# Patient Record
Sex: Male | Born: 1960 | Race: White | Hispanic: No | Marital: Single | Smoking: Former smoker
Health system: Southern US, Community
[De-identification: ages and names within clinical notes are randomized; demographics above are authoritative.]

## PROBLEM LIST (undated history)

## (undated) DIAGNOSIS — I10 Essential (primary) hypertension: Secondary | ICD-10-CM

## (undated) DIAGNOSIS — M1380 Other specified arthritis, unspecified site: Secondary | ICD-10-CM

## (undated) DIAGNOSIS — M1990 Unspecified osteoarthritis, unspecified site: Secondary | ICD-10-CM

## (undated) HISTORY — DX: Other specified arthritis, unspecified site: M13.80

## (undated) HISTORY — DX: Unspecified osteoarthritis, unspecified site: M19.90

## (undated) HISTORY — PX: HEMORRHOID SURGERY: SHX153

## (undated) HISTORY — DX: Essential (primary) hypertension: I10

---

## 2007-03-03 ENCOUNTER — Ambulatory Visit: Payer: Self-pay | Admitting: Internal Medicine

## 2007-03-10 ENCOUNTER — Ambulatory Visit: Payer: Self-pay | Admitting: Podiatry

## 2007-03-11 ENCOUNTER — Ambulatory Visit: Payer: Self-pay | Admitting: Podiatry

## 2007-03-13 ENCOUNTER — Ambulatory Visit: Payer: Self-pay | Admitting: Internal Medicine

## 2007-04-03 ENCOUNTER — Ambulatory Visit: Payer: Self-pay | Admitting: Internal Medicine

## 2007-10-07 ENCOUNTER — Ambulatory Visit: Payer: Self-pay | Admitting: Rheumatology

## 2008-06-29 ENCOUNTER — Ambulatory Visit: Payer: Self-pay | Admitting: Rheumatology

## 2008-07-05 ENCOUNTER — Ambulatory Visit: Payer: Self-pay | Admitting: Rheumatology

## 2014-05-03 DIAGNOSIS — R599 Enlarged lymph nodes, unspecified: Secondary | ICD-10-CM | POA: Insufficient documentation

## 2014-05-03 DIAGNOSIS — M1990 Unspecified osteoarthritis, unspecified site: Secondary | ICD-10-CM | POA: Insufficient documentation

## 2014-05-03 DIAGNOSIS — R591 Generalized enlarged lymph nodes: Secondary | ICD-10-CM | POA: Insufficient documentation

## 2014-11-02 DIAGNOSIS — M1990 Unspecified osteoarthritis, unspecified site: Secondary | ICD-10-CM | POA: Diagnosis not present

## 2015-03-29 DIAGNOSIS — J029 Acute pharyngitis, unspecified: Secondary | ICD-10-CM | POA: Diagnosis not present

## 2015-03-29 DIAGNOSIS — R05 Cough: Secondary | ICD-10-CM | POA: Diagnosis not present

## 2015-05-04 DIAGNOSIS — I00 Rheumatic fever without heart involvement: Secondary | ICD-10-CM | POA: Diagnosis not present

## 2015-10-23 DIAGNOSIS — M1990 Unspecified osteoarthritis, unspecified site: Secondary | ICD-10-CM | POA: Diagnosis not present

## 2015-10-23 DIAGNOSIS — M67431 Ganglion, right wrist: Secondary | ICD-10-CM | POA: Diagnosis not present

## 2016-04-22 DIAGNOSIS — M1990 Unspecified osteoarthritis, unspecified site: Secondary | ICD-10-CM | POA: Diagnosis not present

## 2016-04-22 DIAGNOSIS — Z79899 Other long term (current) drug therapy: Secondary | ICD-10-CM | POA: Diagnosis not present

## 2016-08-08 DIAGNOSIS — M1990 Unspecified osteoarthritis, unspecified site: Secondary | ICD-10-CM

## 2016-08-08 DIAGNOSIS — I10 Essential (primary) hypertension: Secondary | ICD-10-CM

## 2016-09-03 ENCOUNTER — Encounter: Payer: Self-pay | Admitting: Unknown Physician Specialty

## 2016-09-03 ENCOUNTER — Ambulatory Visit (INDEPENDENT_AMBULATORY_CARE_PROVIDER_SITE_OTHER): Payer: Medicare Other | Admitting: Unknown Physician Specialty

## 2016-09-03 VITALS — BP 120/81 | HR 64 | Temp 97.8°F | Ht 70.1 in | Wt 220.6 lb

## 2016-09-03 DIAGNOSIS — M1990 Unspecified osteoarthritis, unspecified site: Secondary | ICD-10-CM | POA: Diagnosis not present

## 2016-09-03 DIAGNOSIS — Z0000 Encounter for general adult medical examination without abnormal findings: Secondary | ICD-10-CM | POA: Diagnosis not present

## 2016-09-03 DIAGNOSIS — I10 Essential (primary) hypertension: Secondary | ICD-10-CM | POA: Diagnosis not present

## 2016-09-03 NOTE — Assessment & Plan Note (Signed)
Stable, continue present medications.   

## 2016-09-03 NOTE — Progress Notes (Signed)
BP 120/81 (BP Location: Left Arm, Patient Position: Sitting, Cuff Size: Large)   Pulse 64   Temp 97.8 F (36.6 C)   Ht 5' 10.1" (1.781 m)   Wt 220 lb 9.6 oz (100.1 kg)   SpO2 97%   BMI 31.56 kg/m    Subjective:    Patient ID: Hunter Huffman, male    DOB: 04-Oct-1960, 56 y.o.   MRN: [id number]  HPI: Hunter Huffman is a 56 y.o. male  Chief Complaint  Patient presents with  . Establish Care  . Labs Only    pt is interested in Hep C and HIV    Sinus Problem  This is a new problem. The current episode started 1 to 4 weeks ago. The problem has been waxing and waning since onset. There has been no fever. He is experiencing no pain. Associated symptoms include congestion. Pertinent negatives include no chills, coughing, shortness of breath or sore throat. Past treatments include nothing. The treatment provided no relief.   Inflammatory Arthritis Through rheumatology   Hypertension Using medications without difficulty Average home BPs Not checking   No problems or lightheadedness No chest pain with exertion or shortness of breath No Edema  Relevant past medical, surgical, family and social history reviewed and updated as indicated. Interim medical history since our last visit reviewed. Allergies and medications reviewed and updated.  Review of Systems  Constitutional: Negative for chills.  HENT: Positive for congestion. Negative for sore throat.   Respiratory: Negative for cough and shortness of breath.   Cardiovascular: Negative.   Gastrointestinal: Negative.   Endocrine: Negative.   Genitourinary: Negative.   Musculoskeletal: Negative.   Skin: Negative.   Allergic/Immunologic: Negative.   Neurological: Negative.   Hematological: Negative.   Psychiatric/Behavioral: Negative.     Per HPI unless specifically indicated above     Objective:    BP 120/81 (BP Location: Left Arm, Patient Position: Sitting, Cuff Size: Large)   Pulse 64   Temp 97.8 F (36.6 C)   Ht  5' 10.1" (1.781 m)   Wt 220 lb 9.6 oz (100.1 kg)   SpO2 97%   BMI 31.56 kg/m   Wt Readings from Last 3 Encounters:  09/03/16 220 lb 9.6 oz (100.1 kg)    Physical Exam  Constitutional: He is oriented to person, place, and time. He appears well-developed and well-nourished.  HENT:  Head: Normocephalic.  Right Ear: Tympanic membrane, external ear and ear canal normal.  Left Ear: Tympanic membrane, external ear and ear canal normal.  Mouth/Throat: Uvula is midline, oropharynx is clear and moist and mucous membranes are normal.  Eyes: Pupils are equal, round, and reactive to light.  Cardiovascular: Normal rate, regular rhythm and normal heart sounds.  Exam reveals no gallop and no friction rub.   No murmur heard. Pulmonary/Chest: Effort normal and breath sounds normal. No respiratory distress.  Abdominal: Soft. Bowel sounds are normal. He exhibits no distension. There is no tenderness.  Genitourinary: Rectum normal and prostate normal.  Musculoskeletal: Normal range of motion.  Neurological: He is alert and oriented to person, place, and time. He has normal reflexes.  Skin: Skin is warm and dry.  Psychiatric: He has a normal mood and affect. His behavior is normal. Judgment and thought content normal.    No results found for this or any previous visit.    Assessment & Plan:   Problem List Items Addressed This Visit      Unprioritized   Hypertension  Stable, continue present medications.        Relevant Medications   hydrochlorothiazide (HYDRODIURIL) 25 MG tablet   Inflammatory arthritis    Per rheumatology      Relevant Medications   ENBREL SURECLICK 50 MG/ML injection   naproxen (NAPROSYN) 500 MG tablet    Other Visit Diagnoses    Routine general medical examination at a health care facility    -  Primary   Relevant Orders   Comprehensive metabolic panel   PSA   TSH   CBC with Differential/Platelet   Lipid Panel w/o Chol/HDL Ratio   Ambulatory referral to  Gastroenterology       Follow up plan: Return in about 1 year (around 09/03/2017).

## 2016-09-03 NOTE — Assessment & Plan Note (Signed)
Per rheumatology 

## 2016-09-04 ENCOUNTER — Encounter: Payer: Self-pay | Admitting: Unknown Physician Specialty

## 2016-09-04 LAB — COMPREHENSIVE METABOLIC PANEL
ALK PHOS: 72 IU/L (ref 39–117)
ALT: 20 IU/L (ref 0–44)
AST: 18 IU/L (ref 0–40)
Albumin/Globulin Ratio: 1.6 (ref 1.2–2.2)
Albumin: 4.1 g/dL (ref 3.5–5.5)
BILIRUBIN TOTAL: 0.6 mg/dL (ref 0.0–1.2)
BUN / CREAT RATIO: 11 (ref 9–20)
BUN: 12 mg/dL (ref 6–24)
CHLORIDE: 102 mmol/L (ref 96–106)
CO2: 24 mmol/L (ref 18–29)
CREATININE: 1.08 mg/dL (ref 0.76–1.27)
Calcium: 9.6 mg/dL (ref 8.7–10.2)
GFR calc Af Amer: 88 mL/min/{1.73_m2} (ref >59)
GFR calc non Af Amer: 76 mL/min/{1.73_m2} (ref >59)
GLUCOSE: 90 mg/dL (ref 65–99)
Globulin, Total: 2.5 g/dL (ref 1.5–4.5)
Potassium: 4.7 mmol/L (ref 3.5–5.2)
Sodium: 142 mmol/L (ref 134–144)
Total Protein: 6.6 g/dL (ref 6.0–8.5)

## 2016-09-04 LAB — CBC WITH DIFFERENTIAL/PLATELET
BASOS ABS: 0 10*3/uL (ref 0.0–0.2)
Basos: 0 %
EOS (ABSOLUTE): 0.3 10*3/uL (ref 0.0–0.4)
Eos: 3 %
HEMOGLOBIN: 15.2 g/dL (ref 13.0–17.7)
Hematocrit: 44.9 % (ref 37.5–51.0)
Immature Grans (Abs): 0 10*3/uL (ref 0.0–0.1)
Immature Granulocytes: 0 %
LYMPHS ABS: 3.7 10*3/uL — AB (ref 0.7–3.1)
Lymphs: 37 %
MCH: 31.8 pg (ref 26.6–33.0)
MCHC: 33.9 g/dL (ref 31.5–35.7)
MCV: 94 fL (ref 79–97)
MONOCYTES: 7 %
Monocytes Absolute: 0.7 10*3/uL (ref 0.1–0.9)
Neutrophils Absolute: 5.2 10*3/uL (ref 1.4–7.0)
Neutrophils: 53 %
Platelets: 243 10*3/uL (ref 150–379)
RBC: 4.78 x10E6/uL (ref 4.14–5.80)
RDW: 13.7 % (ref 12.3–15.4)
WBC: 9.9 10*3/uL (ref 3.4–10.8)

## 2016-09-04 LAB — LIPID PANEL W/O CHOL/HDL RATIO
CHOLESTEROL TOTAL: 153 mg/dL (ref 100–199)
HDL: 36 mg/dL — ABNORMAL LOW (ref >39)
LDL CALC: 93 mg/dL (ref 0–99)
TRIGLYCERIDES: 118 mg/dL (ref 0–149)
VLDL Cholesterol Cal: 24 mg/dL (ref 5–40)

## 2016-09-04 LAB — TSH: TSH: 2.57 u[IU]/mL (ref 0.450–4.500)

## 2016-09-04 LAB — PSA: PROSTATE SPECIFIC AG, SERUM: 0.8 ng/mL (ref 0.0–4.0)

## 2016-12-02 DIAGNOSIS — B079 Viral wart, unspecified: Secondary | ICD-10-CM | POA: Diagnosis not present

## 2016-12-02 DIAGNOSIS — C44619 Basal cell carcinoma of skin of left upper limb, including shoulder: Secondary | ICD-10-CM | POA: Diagnosis not present

## 2016-12-02 DIAGNOSIS — L570 Actinic keratosis: Secondary | ICD-10-CM | POA: Diagnosis not present

## 2016-12-02 DIAGNOSIS — D485 Neoplasm of uncertain behavior of skin: Secondary | ICD-10-CM | POA: Diagnosis not present

## 2017-01-06 DIAGNOSIS — C44519 Basal cell carcinoma of skin of other part of trunk: Secondary | ICD-10-CM | POA: Diagnosis not present

## 2017-01-20 DIAGNOSIS — Z85828 Personal history of other malignant neoplasm of skin: Secondary | ICD-10-CM | POA: Diagnosis not present

## 2017-01-20 DIAGNOSIS — Z872 Personal history of diseases of the skin and subcutaneous tissue: Secondary | ICD-10-CM | POA: Diagnosis not present

## 2017-01-22 DIAGNOSIS — M1990 Unspecified osteoarthritis, unspecified site: Secondary | ICD-10-CM | POA: Diagnosis not present

## 2017-07-22 ENCOUNTER — Ambulatory Visit (INDEPENDENT_AMBULATORY_CARE_PROVIDER_SITE_OTHER): Payer: Medicare Other

## 2017-07-22 DIAGNOSIS — Z23 Encounter for immunization: Secondary | ICD-10-CM | POA: Diagnosis not present

## 2017-09-05 ENCOUNTER — Encounter: Payer: Self-pay | Admitting: Unknown Physician Specialty

## 2017-09-05 ENCOUNTER — Ambulatory Visit (INDEPENDENT_AMBULATORY_CARE_PROVIDER_SITE_OTHER): Payer: Medicare Other | Admitting: Unknown Physician Specialty

## 2017-09-05 VITALS — BP 126/78 | HR 67 | Temp 98.5°F | Ht 70.1 in | Wt 210.2 lb

## 2017-09-05 DIAGNOSIS — E7800 Pure hypercholesterolemia, unspecified: Secondary | ICD-10-CM | POA: Diagnosis not present

## 2017-09-05 DIAGNOSIS — Z0000 Encounter for general adult medical examination without abnormal findings: Secondary | ICD-10-CM | POA: Diagnosis not present

## 2017-09-05 DIAGNOSIS — F17210 Nicotine dependence, cigarettes, uncomplicated: Secondary | ICD-10-CM | POA: Diagnosis not present

## 2017-09-05 DIAGNOSIS — I10 Essential (primary) hypertension: Secondary | ICD-10-CM

## 2017-09-05 DIAGNOSIS — Z0001 Encounter for general adult medical examination with abnormal findings: Secondary | ICD-10-CM

## 2017-09-05 DIAGNOSIS — K219 Gastro-esophageal reflux disease without esophagitis: Secondary | ICD-10-CM | POA: Diagnosis not present

## 2017-09-05 DIAGNOSIS — M1990 Unspecified osteoarthritis, unspecified site: Secondary | ICD-10-CM | POA: Diagnosis not present

## 2017-09-05 DIAGNOSIS — Z720 Tobacco use: Secondary | ICD-10-CM

## 2017-09-05 NOTE — Assessment & Plan Note (Signed)
I have recommended absolute tobacco cessation. I have discussed various options available for assistance with tobacco cessation including over the counter methods (Nicotine gum, patch and lozenges). We also discussed prescription options (Chantix, Nicotine Inhaler / Nasal Spray). The patient is not interested in pursuing any prescription tobacco cessation options at this time.  

## 2017-09-05 NOTE — Assessment & Plan Note (Signed)
ASCVD risk discussed.  Refuses statins at this time

## 2017-09-05 NOTE — Assessment & Plan Note (Signed)
Stable, continue present medications.   

## 2017-09-05 NOTE — Progress Notes (Signed)
BP 126/78   Pulse 67   Temp 98.5 F (36.9 C) (Oral)   Ht 5' 10.1" (1.781 m)   Wt 210 lb 3.2 oz (95.3 kg)   SpO2 96%   BMI 30.07 kg/m    Subjective:    Patient ID: Hunter Huffman, male    DOB: 07/24/1961, 57 y.o.   MRN: [id number]  HPI: Hunter Huffman is a 57 y.o. male  Chief Complaint  Patient presents with  . Medicare Wellness   Hypertension Using medications without difficulty Average home BPs   No problems or lightheadedness No chest pain with exertion or shortness of breath No Edema  The 10-year ASCVD risk score Mikey Bussing DC Jr., et al., 2013) is: 13%   Values used to calculate the score:     Age: 30 years     Sex: Male     Is Non-Hispanic African American: No     Diabetic: No     Tobacco smoker: Yes     Systolic Blood Pressure: 782 mmHg     Is BP treated: Yes     HDL Cholesterol: 36 mg/dL     Total Cholesterol: 153 mg/dL  GERD Managed with Omeprazole.  No stomach issues.    Family History  Problem Relation Age of Onset  . Diabetes Mother   . Diabetes Father   . Hypertension Father   . Arthritis Father   . Diabetes Brother    Social History   Socioeconomic History  . Marital status: Single    Spouse name: Not on file  . Number of children: Not on file  . Years of education: Not on file  . Highest education level: Not on file  Social Needs  . Financial resource strain: Not on file  . Food insecurity - worry: Not on file  . Food insecurity - inability: Not on file  . Transportation needs - medical: Not on file  . Transportation needs - non-medical: Not on file  Occupational History  . Not on file  Tobacco Use  . Smoking status: Current Every Day Smoker    Packs/day: 0.50    Types: Cigarettes  . Smokeless tobacco: Never Used  Substance and Sexual Activity  . Alcohol use: No    Comment: very rarely  . Drug use: No  . Sexual activity: Yes  Other Topics Concern  . Not on file  Social History Narrative  . Not on file   Past Medical  History:  Diagnosis Date  . Hypertension   . Inflammatory arthritis    Past Surgical History:  Procedure Laterality Date  . HEMORRHOID SURGERY     Relevant past medical, surgical, family and social history reviewed and updated as indicated. Interim medical history since our last visit reviewed. Allergies and medications reviewed and updated.  Review of Systems  Constitutional: Negative.   HENT: Negative.   Eyes: Negative.   Respiratory: Negative.   Cardiovascular: Negative.   Gastrointestinal: Negative.   Endocrine: Negative.   Genitourinary: Negative.   Musculoskeletal: Positive for arthralgias.  Skin: Negative.   Allergic/Immunologic: Negative.   Neurological: Negative.   Hematological: Negative.   Psychiatric/Behavioral: Negative.     Per HPI unless specifically indicated above     Objective:    BP 126/78   Pulse 67   Temp 98.5 F (36.9 C) (Oral)   Ht 5' 10.1" (1.781 m)   Wt 210 lb 3.2 oz (95.3 kg)   SpO2 96%   BMI 30.07 kg/m   Wt  Readings from Last 3 Encounters:  09/05/17 210 lb 3.2 oz (95.3 kg)  09/03/16 220 lb 9.6 oz (100.1 kg)    Physical Exam  Constitutional: He is oriented to person, place, and time. He appears well-developed and well-nourished. No distress.  HENT:  Head: Normocephalic and atraumatic.  Eyes: Conjunctivae and lids are normal. Right eye exhibits no discharge. Left eye exhibits no discharge. No scleral icterus.  Neck: Normal range of motion. Neck supple. No JVD present. Carotid bruit is not present.  Cardiovascular: Normal rate, regular rhythm and normal heart sounds.  Pulmonary/Chest: Effort normal and breath sounds normal. No respiratory distress.  Abdominal: Normal appearance. There is no splenomegaly or hepatomegaly.  Musculoskeletal: Normal range of motion.  Neurological: He is alert and oriented to person, place, and time.  Skin: Skin is warm, dry and intact. No rash noted. No pallor.  Psychiatric: He has a normal mood and  affect. His behavior is normal. Judgment and thought content normal.    Results for orders placed or performed in visit on 09/03/16  Comprehensive metabolic panel  Result Value Ref Range   Glucose 90 65 - 99 mg/dL   BUN 12 6 - 24 mg/dL   Creatinine, Ser 1.08 0.76 - 1.27 mg/dL   GFR calc non Af Amer 76 >59 mL/min/1.73   GFR calc Af Amer 88 >59 mL/min/1.73   BUN/Creatinine Ratio 11 9 - 20   Sodium 142 134 - 144 mmol/L   Potassium 4.7 3.5 - 5.2 mmol/L   Chloride 102 96 - 106 mmol/L   CO2 24 18 - 29 mmol/L   Calcium 9.6 8.7 - 10.2 mg/dL   Total Protein 6.6 6.0 - 8.5 g/dL   Albumin 4.1 3.5 - 5.5 g/dL   Globulin, Total 2.5 1.5 - 4.5 g/dL   Albumin/Globulin Ratio 1.6 1.2 - 2.2   Bilirubin Total 0.6 0.0 - 1.2 mg/dL   Alkaline Phosphatase 72 39 - 117 IU/L   AST 18 0 - 40 IU/L   ALT 20 0 - 44 IU/L  PSA  Result Value Ref Range   Prostate Specific Ag, Serum 0.8 0.0 - 4.0 ng/mL  TSH  Result Value Ref Range   TSH 2.570 0.450 - 4.500 uIU/mL  CBC with Differential/Platelet  Result Value Ref Range   WBC 9.9 3.4 - 10.8 x10E3/uL   RBC 4.78 4.14 - 5.80 x10E6/uL   Hemoglobin 15.2 13.0 - 17.7 g/dL   Hematocrit 44.9 37.5 - 51.0 %   MCV 94 79 - 97 fL   MCH 31.8 26.6 - 33.0 pg   MCHC 33.9 31.5 - 35.7 g/dL   RDW 13.7 12.3 - 15.4 %   Platelets 243 150 - 379 x10E3/uL   Neutrophils 53 Not Estab. %   Lymphs 37 Not Estab. %   Monocytes 7 Not Estab. %   Eos 3 Not Estab. %   Basos 0 Not Estab. %   Neutrophils Absolute 5.2 1.4 - 7.0 x10E3/uL   Lymphocytes Absolute 3.7 (H) 0.7 - 3.1 x10E3/uL   Monocytes Absolute 0.7 0.1 - 0.9 x10E3/uL   EOS (ABSOLUTE) 0.3 0.0 - 0.4 x10E3/uL   Basophils Absolute 0.0 0.0 - 0.2 x10E3/uL   Immature Granulocytes 0 Not Estab. %   Immature Grans (Abs) 0.0 0.0 - 0.1 x10E3/uL  Lipid Panel w/o Chol/HDL Ratio  Result Value Ref Range   Cholesterol, Total 153 100 - 199 mg/dL   Triglycerides 118 0 - 149 mg/dL   HDL 36 (L) >39 mg/dL   VLDL  Cholesterol Cal 24 5 - 40 mg/dL    LDL Calculated 93 0 - 99 mg/dL      Assessment & Plan:   Problem List Items Addressed This Visit      Unprioritized   GERD (gastroesophageal reflux disease)    Stable, continue present medications.        Hypercholesteremia    ASCVD risk discussed.  Refuses statins at this time      Relevant Orders   Lipid Panel w/o Chol/HDL Ratio   Hypertension    Stable, continue present medications.        Relevant Orders   Comprehensive metabolic panel   Inflammatory arthritis    Taking Embrel.  Through Dr Jefm Bryant.  Labs for med management      Tobacco abuse     I have recommended absolute tobacco cessation. I have discussed various options available for assistance with tobacco cessation including over the counter methods (Nicotine gum, patch and lozenges). We also discussed prescription options (Chantix, Nicotine Inhaler / Nasal Spray). The patient is not interested in pursuing any prescription tobacco cessation options at this time.        Other Visit Diagnoses    Annual physical exam    -  Primary   Relevant Orders   CBC with Differential/Platelet   TSH   PSA      Refusing prostate exam, screening colonoscopy, HIV and Hepatitis C  Follow up plan: Return in about 6 months (around 03/05/2018).

## 2017-09-05 NOTE — Assessment & Plan Note (Signed)
Taking Embrel.  Through Dr Jefm Bryant.  Labs for med management

## 2017-09-06 LAB — COMPREHENSIVE METABOLIC PANEL
ALBUMIN: 4.2 g/dL (ref 3.5–5.5)
ALT: 21 IU/L (ref 0–44)
AST: 19 IU/L (ref 0–40)
Albumin/Globulin Ratio: 1.8 (ref 1.2–2.2)
Alkaline Phosphatase: 72 IU/L (ref 39–117)
BILIRUBIN TOTAL: 0.7 mg/dL (ref 0.0–1.2)
BUN / CREAT RATIO: 11 (ref 9–20)
BUN: 12 mg/dL (ref 6–24)
CHLORIDE: 102 mmol/L (ref 96–106)
CO2: 22 mmol/L (ref 20–29)
Calcium: 9.5 mg/dL (ref 8.7–10.2)
Creatinine, Ser: 1.11 mg/dL (ref 0.76–1.27)
GFR calc non Af Amer: 73 mL/min/{1.73_m2} (ref >59)
GFR, EST AFRICAN AMERICAN: 85 mL/min/{1.73_m2} (ref >59)
GLOBULIN, TOTAL: 2.3 g/dL (ref 1.5–4.5)
GLUCOSE: 84 mg/dL (ref 65–99)
Potassium: 4.5 mmol/L (ref 3.5–5.2)
Sodium: 141 mmol/L (ref 134–144)
TOTAL PROTEIN: 6.5 g/dL (ref 6.0–8.5)

## 2017-09-06 LAB — CBC WITH DIFFERENTIAL/PLATELET
BASOS ABS: 0.1 10*3/uL (ref 0.0–0.2)
BASOS: 1 %
EOS (ABSOLUTE): 0.2 10*3/uL (ref 0.0–0.4)
EOS: 2 %
HEMATOCRIT: 45 % (ref 37.5–51.0)
HEMOGLOBIN: 14.8 g/dL (ref 13.0–17.7)
IMMATURE GRANS (ABS): 0 10*3/uL (ref 0.0–0.1)
Immature Granulocytes: 0 %
LYMPHS: 38 %
Lymphocytes Absolute: 2.8 10*3/uL (ref 0.7–3.1)
MCH: 31.6 pg (ref 26.6–33.0)
MCHC: 32.9 g/dL (ref 31.5–35.7)
MCV: 96 fL (ref 79–97)
MONOCYTES: 7 %
Monocytes Absolute: 0.5 10*3/uL (ref 0.1–0.9)
NEUTROS ABS: 3.8 10*3/uL (ref 1.4–7.0)
Neutrophils: 52 %
Platelets: 249 10*3/uL (ref 150–379)
RBC: 4.69 x10E6/uL (ref 4.14–5.80)
RDW: 13.6 % (ref 12.3–15.4)
WBC: 7.3 10*3/uL (ref 3.4–10.8)

## 2017-09-06 LAB — PSA: Prostate Specific Ag, Serum: 0.7 ng/mL (ref 0.0–4.0)

## 2017-09-06 LAB — LIPID PANEL W/O CHOL/HDL RATIO
CHOLESTEROL TOTAL: 159 mg/dL (ref 100–199)
HDL: 35 mg/dL — ABNORMAL LOW (ref >39)
LDL Calculated: 105 mg/dL — ABNORMAL HIGH (ref 0–99)
TRIGLYCERIDES: 97 mg/dL (ref 0–149)
VLDL Cholesterol Cal: 19 mg/dL (ref 5–40)

## 2017-09-06 LAB — TSH: TSH: 2.62 u[IU]/mL (ref 0.450–4.500)

## 2017-09-08 ENCOUNTER — Encounter: Payer: Self-pay | Admitting: Unknown Physician Specialty

## 2017-09-08 ENCOUNTER — Telehealth: Payer: Self-pay | Admitting: *Deleted

## 2017-09-08 DIAGNOSIS — Z87891 Personal history of nicotine dependence: Secondary | ICD-10-CM

## 2017-09-08 DIAGNOSIS — Z122 Encounter for screening for malignant neoplasm of respiratory organs: Secondary | ICD-10-CM

## 2017-09-08 NOTE — Telephone Encounter (Signed)
Received referral for initial lung cancer screening scan. Contacted patient and obtained smoking history,(current, 31.5 pack year) as well as answering questions related to screening process. Patient denies signs of lung cancer such as weight loss or hemoptysis. Patient denies comorbidity that would prevent curative treatment if lung cancer were found. Patient is scheduled for shared decision making visit and CT scan on 09/23/17.

## 2017-09-11 ENCOUNTER — Telehealth: Payer: Self-pay

## 2017-09-11 NOTE — Telephone Encounter (Signed)
Called to schedule medicare annual wellness visit with patient. He states he will call back to schedule this appt.

## 2017-09-19 ENCOUNTER — Telehealth: Payer: Self-pay | Admitting: *Deleted

## 2017-09-19 NOTE — Telephone Encounter (Signed)
Patient refused lung screening scan. Reviewed rationale for screening and implications for lung cancer not found at early stage. Patient is aware and does not want to have LCS scan at this time.

## 2017-09-23 ENCOUNTER — Inpatient Hospital Stay: Payer: Medicare Other | Admitting: Nurse Practitioner

## 2017-09-23 ENCOUNTER — Ambulatory Visit: Payer: Medicare Other

## 2017-10-22 DIAGNOSIS — G8929 Other chronic pain: Secondary | ICD-10-CM | POA: Diagnosis not present

## 2017-10-22 DIAGNOSIS — M1990 Unspecified osteoarthritis, unspecified site: Secondary | ICD-10-CM | POA: Diagnosis not present

## 2017-10-22 DIAGNOSIS — M25571 Pain in right ankle and joints of right foot: Secondary | ICD-10-CM | POA: Diagnosis not present

## 2017-10-22 DIAGNOSIS — M79641 Pain in right hand: Secondary | ICD-10-CM | POA: Diagnosis not present

## 2017-10-24 DIAGNOSIS — M25531 Pain in right wrist: Secondary | ICD-10-CM | POA: Diagnosis not present

## 2017-10-24 DIAGNOSIS — M25431 Effusion, right wrist: Secondary | ICD-10-CM | POA: Diagnosis not present

## 2017-10-24 DIAGNOSIS — M19231 Secondary osteoarthritis, right wrist: Secondary | ICD-10-CM | POA: Diagnosis not present

## 2017-12-03 DIAGNOSIS — L308 Other specified dermatitis: Secondary | ICD-10-CM | POA: Diagnosis not present

## 2017-12-03 DIAGNOSIS — D485 Neoplasm of uncertain behavior of skin: Secondary | ICD-10-CM | POA: Diagnosis not present

## 2017-12-03 DIAGNOSIS — C44629 Squamous cell carcinoma of skin of left upper limb, including shoulder: Secondary | ICD-10-CM | POA: Diagnosis not present

## 2017-12-30 DIAGNOSIS — L570 Actinic keratosis: Secondary | ICD-10-CM | POA: Diagnosis not present

## 2017-12-30 DIAGNOSIS — C44629 Squamous cell carcinoma of skin of left upper limb, including shoulder: Secondary | ICD-10-CM | POA: Diagnosis not present

## 2018-02-04 ENCOUNTER — Telehealth: Payer: Self-pay

## 2018-02-04 NOTE — Telephone Encounter (Signed)
Copied from [place] [id number]. Topic: Medicare AWV >> Feb 04, 2018  2:37 PM Kaysie Michelini A, LPN wrote: Reason for CRM: Called to schedule medicare annual wellness visit with NHA- Elijah Michaelis,LPN at [facility]. Please schedule anytime. Any questions please contact Joanette Gula on skype or by phone- [phone]   AWV-I

## 2018-03-10 ENCOUNTER — Ambulatory Visit: Payer: Self-pay | Admitting: Unknown Physician Specialty

## 2018-06-29 ENCOUNTER — Ambulatory Visit (INDEPENDENT_AMBULATORY_CARE_PROVIDER_SITE_OTHER): Payer: Medicare Other

## 2018-06-29 DIAGNOSIS — Z23 Encounter for immunization: Secondary | ICD-10-CM | POA: Diagnosis not present

## 2018-07-10 ENCOUNTER — Ambulatory Visit (INDEPENDENT_AMBULATORY_CARE_PROVIDER_SITE_OTHER): Payer: Medicare Other | Admitting: Unknown Physician Specialty

## 2018-07-10 ENCOUNTER — Encounter: Payer: Self-pay | Admitting: Unknown Physician Specialty

## 2018-07-10 DIAGNOSIS — I10 Essential (primary) hypertension: Secondary | ICD-10-CM

## 2018-07-10 DIAGNOSIS — M1990 Unspecified osteoarthritis, unspecified site: Secondary | ICD-10-CM

## 2018-07-10 DIAGNOSIS — G44009 Cluster headache syndrome, unspecified, not intractable: Secondary | ICD-10-CM | POA: Insufficient documentation

## 2018-07-10 DIAGNOSIS — E7800 Pure hypercholesterolemia, unspecified: Secondary | ICD-10-CM

## 2018-07-10 MED ORDER — ATORVASTATIN CALCIUM 20 MG PO TABS: 20.0000 mg | ORAL_TABLET | Freq: Every day | ORAL | 3 refills | Status: DC

## 2018-07-10 NOTE — Assessment & Plan Note (Addendum)
Willing to start statin.  Start Atorvastatin 20 mg.  Draw labs for physical

## 2018-07-10 NOTE — Assessment & Plan Note (Addendum)
On Embrel and following with rheumatology

## 2018-07-10 NOTE — Progress Notes (Signed)
BP 128/79   Pulse (!) 53   Temp 98 F (36.7 C) (Oral)   Wt 213 lb (96.6 kg)   SpO2 96%   BMI 30.48 kg/m    Subjective:    Patient ID: Hunter Huffman, male    DOB: 09/30/1960, 57 y.o.   MRN: [id number]  HPI: Hunter Huffman is a 57 y.o. male  Chief Complaint  Patient presents with  . Follow-up  . Hypertension  . Hyperlipidemia   Hypertension Using medications without difficulty Average home BPs Not checking   No problems or lightheadedness No chest pain with exertion or shortness of breath No Edema   Hyperlipidemia In the past, refused statins but willing to consider The 10-year ASCVD risk score Mikey Bussing DC Brooke Bonito., et al., 2013) is: 14.9%   Values used to calculate the score:     Age: 46 years     Sex: Male     Is Non-Hispanic African American: No     Diabetic: No     Tobacco smoker: Yes     Systolic Blood Pressure: 546 mmHg     Is BP treated: Yes     HDL Cholesterol: 35 mg/dL     Total Cholesterol: 159 mg/dL  Relevant past medical, surgical, family and social history reviewed and updated as indicated. Interim medical history since our last visit reviewed. Allergies and medications reviewed and updated.  Review of Systems  Constitutional: Negative.   HENT: Negative.   Eyes: Negative.   Respiratory: Negative.   Cardiovascular: Negative.   Gastrointestinal: Negative.   Endocrine: Negative.   Genitourinary: Negative.   Skin: Negative.   Allergic/Immunologic: Negative.   Neurological: Negative.   Hematological: Negative.   Psychiatric/Behavioral: Negative.     Per HPI unless specifically indicated above     Objective:    BP 128/79   Pulse (!) 53   Temp 98 F (36.7 C) (Oral)   Wt 213 lb (96.6 kg)   SpO2 96%   BMI 30.48 kg/m   Wt Readings from Last 3 Encounters:  07/10/18 213 lb (96.6 kg)  09/05/17 210 lb 3.2 oz (95.3 kg)  09/03/16 220 lb 9.6 oz (100.1 kg)    Physical Exam  Constitutional: He is oriented to person, place, and time. He appears  well-developed and well-nourished. No distress.  HENT:  Head: Normocephalic and atraumatic.  Eyes: Conjunctivae and lids are normal. Right eye exhibits no discharge. Left eye exhibits no discharge. No scleral icterus.  Neck: Normal range of motion. Neck supple. No JVD present. Carotid bruit is not present.  Cardiovascular: Normal rate, regular rhythm and normal heart sounds.  Pulmonary/Chest: Effort normal and breath sounds normal. No respiratory distress.  Abdominal: Normal appearance. There is no splenomegaly or hepatomegaly.  Musculoskeletal: Normal range of motion.  Neurological: He is alert and oriented to person, place, and time.  Skin: Skin is warm, dry and intact. No rash noted. No pallor.  Psychiatric: He has a normal mood and affect. His behavior is normal. Judgment and thought content normal.    Results for orders placed or performed in visit on 09/05/17  Lipid Panel w/o Chol/HDL Ratio  Result Value Ref Range   Cholesterol, Total 159 100 - 199 mg/dL   Triglycerides 97 0 - 149 mg/dL   HDL 35 (L) >39 mg/dL   VLDL Cholesterol Cal 19 5 - 40 mg/dL   LDL Calculated 105 (H) 0 - 99 mg/dL  CBC with Differential/Platelet  Result Value Ref Range   WBC  7.3 3.4 - 10.8 x10E3/uL   RBC 4.69 4.14 - 5.80 x10E6/uL   Hemoglobin 14.8 13.0 - 17.7 g/dL   Hematocrit 45.0 37.5 - 51.0 %   MCV 96 79 - 97 fL   MCH 31.6 26.6 - 33.0 pg   MCHC 32.9 31.5 - 35.7 g/dL   RDW 13.6 12.3 - 15.4 %   Platelets 249 150 - 379 x10E3/uL   Neutrophils 52 Not Estab. %   Lymphs 38 Not Estab. %   Monocytes 7 Not Estab. %   Eos 2 Not Estab. %   Basos 1 Not Estab. %   Neutrophils Absolute 3.8 1.4 - 7.0 x10E3/uL   Lymphocytes Absolute 2.8 0.7 - 3.1 x10E3/uL   Monocytes Absolute 0.5 0.1 - 0.9 x10E3/uL   EOS (ABSOLUTE) 0.2 0.0 - 0.4 x10E3/uL   Basophils Absolute 0.1 0.0 - 0.2 x10E3/uL   Immature Granulocytes 0 Not Estab. %   Immature Grans (Abs) 0.0 0.0 - 0.1 x10E3/uL  Comprehensive metabolic panel  Result Value  Ref Range   Glucose 84 65 - 99 mg/dL   BUN 12 6 - 24 mg/dL   Creatinine, Ser 1.11 0.76 - 1.27 mg/dL   GFR calc non Af Amer 73 >59 mL/min/1.73   GFR calc Af Amer 85 >59 mL/min/1.73   BUN/Creatinine Ratio 11 9 - 20   Sodium 141 134 - 144 mmol/L   Potassium 4.5 3.5 - 5.2 mmol/L   Chloride 102 96 - 106 mmol/L   CO2 22 20 - 29 mmol/L   Calcium 9.5 8.7 - 10.2 mg/dL   Total Protein 6.5 6.0 - 8.5 g/dL   Albumin 4.2 3.5 - 5.5 g/dL   Globulin, Total 2.3 1.5 - 4.5 g/dL   Albumin/Globulin Ratio 1.8 1.2 - 2.2   Bilirubin Total 0.7 0.0 - 1.2 mg/dL   Alkaline Phosphatase 72 39 - 117 IU/L   AST 19 0 - 40 IU/L   ALT 21 0 - 44 IU/L  TSH  Result Value Ref Range   TSH 2.620 0.450 - 4.500 uIU/mL  PSA  Result Value Ref Range   Prostate Specific Ag, Serum 0.7 0.0 - 4.0 ng/mL      Assessment & Plan:   Problem List Items Addressed This Visit      Unprioritized   Degenerative arthritis    On Embrel and following with rheumatology      Hypercholesteremia    Willing to start statin.  Start Atorvastatin 20 mg.  Draw labs for physical      Relevant Medications   atorvastatin (LIPITOR) 20 MG tablet   Hypertension    Stable, continue present medications.        Relevant Medications   atorvastatin (LIPITOR) 20 MG tablet       Follow up plan: Return in about 3 months (around 10/10/2018) for physical.

## 2018-07-10 NOTE — Assessment & Plan Note (Signed)
Stable, continue present medications.   

## 2018-07-13 DIAGNOSIS — L570 Actinic keratosis: Secondary | ICD-10-CM | POA: Diagnosis not present

## 2018-07-13 DIAGNOSIS — Z872 Personal history of diseases of the skin and subcutaneous tissue: Secondary | ICD-10-CM | POA: Diagnosis not present

## 2018-07-13 DIAGNOSIS — L578 Other skin changes due to chronic exposure to nonionizing radiation: Secondary | ICD-10-CM | POA: Diagnosis not present

## 2018-07-13 DIAGNOSIS — Z1283 Encounter for screening for malignant neoplasm of skin: Secondary | ICD-10-CM | POA: Diagnosis not present

## 2018-07-13 DIAGNOSIS — Z859 Personal history of malignant neoplasm, unspecified: Secondary | ICD-10-CM | POA: Diagnosis not present

## 2018-07-22 DIAGNOSIS — M1990 Unspecified osteoarthritis, unspecified site: Secondary | ICD-10-CM | POA: Diagnosis not present

## 2018-07-22 DIAGNOSIS — M25571 Pain in right ankle and joints of right foot: Secondary | ICD-10-CM | POA: Diagnosis not present

## 2018-07-22 DIAGNOSIS — G8929 Other chronic pain: Secondary | ICD-10-CM | POA: Diagnosis not present

## 2018-07-24 ENCOUNTER — Telehealth: Payer: Self-pay | Admitting: Nurse Practitioner

## 2018-07-24 NOTE — Telephone Encounter (Signed)
Declined AWV  °

## 2018-09-08 ENCOUNTER — Encounter: Payer: Self-pay | Admitting: Nurse Practitioner

## 2018-09-15 ENCOUNTER — Ambulatory Visit (INDEPENDENT_AMBULATORY_CARE_PROVIDER_SITE_OTHER): Payer: Medicare Other | Admitting: Nurse Practitioner

## 2018-09-15 ENCOUNTER — Ambulatory Visit
Admission: RE | Admit: 2018-09-15 | Discharge: 2018-09-15 | Disposition: A | Discharge status: Home / Self Care | Payer: Medicare Other | Source: Ambulatory Visit | Attending: Nurse Practitioner | Admitting: Nurse Practitioner

## 2018-09-15 ENCOUNTER — Encounter: Payer: Self-pay | Admitting: Nurse Practitioner

## 2018-09-15 VITALS — BP 130/82 | HR 59 | Temp 97.6°F | Wt 214.5 lb

## 2018-09-15 DIAGNOSIS — R05 Cough: Secondary | ICD-10-CM | POA: Diagnosis not present

## 2018-09-15 DIAGNOSIS — J0100 Acute maxillary sinusitis, unspecified: Secondary | ICD-10-CM | POA: Diagnosis not present

## 2018-09-15 DIAGNOSIS — R059 Cough, unspecified: Secondary | ICD-10-CM

## 2018-09-15 MED ORDER — AMOXICILLIN-POT CLAVULANATE 875-125 MG PO TABS: 1.0000 | ORAL_TABLET | Freq: Two times a day (BID) | ORAL | 0 refills | Status: AC

## 2018-09-15 MED ORDER — PREDNISONE 10 MG PO TABS: 30.0000 mg | ORAL_TABLET | Freq: Every day | ORAL | 0 refills | Status: AC

## 2018-09-15 NOTE — Progress Notes (Signed)
BP 130/82   Pulse (!) 59   Temp 97.6 F (36.4 C) (Oral)   Wt 214 lb 8 oz (97.3 kg)   SpO2 96%   BMI 30.69 kg/m    Subjective:    Patient ID: Hunter Huffman, male    DOB: [birth year], 58 y.o.   MRN: [id number]  HPI: Hunter Huffman is a 58 y.o. male  Chief Complaint  Patient presents with  . URI    pt states he has had chest congestion, cough, sinus pressure and headache for about 2 weeks    UPPER RESPIRATORY TRACT INFECTION Presented 2-3 weeks ago.  Went on cruise last week.   Worst symptom: cough and nasal congestion Fever: no Cough: yes Shortness of breath: occasional Wheezing: yes Chest pain: no Chest tightness: yes Chest congestion: yes Nasal congestion: yes Runny nose: yes Post nasal drip: yes Sneezing: no Sore throat: no Swollen glands: no Sinus pressure: yes Headache: yes Face pain: no Toothache: no Ear pain: no pain Ear pressure: none Eyes red/itching:no Eye drainage/crusting: no  Vomiting: no Rash: no Fatigue: yes Sick contacts: yes Strep contacts: no  Context: fluctuating Recurrent sinusitis: no Relief with OTC cold/cough medications: no  Treatments attempted: none   Relevant past medical, surgical, family and social history reviewed and updated as indicated. Interim medical history since our last visit reviewed. Allergies and medications reviewed and updated.  Review of Systems  Constitutional: Positive for fatigue. Negative for activity change, diaphoresis and fever.  HENT: Positive for congestion, postnasal drip, rhinorrhea, sinus pressure and sinus pain. Negative for ear discharge, ear pain, sneezing, sore throat, tinnitus and voice change.   Respiratory: Positive for cough, chest tightness, shortness of breath (intermittent) and wheezing.   Cardiovascular: Negative for chest pain, palpitations and leg swelling.  Gastrointestinal: Negative for abdominal distention, abdominal pain, constipation, diarrhea, nausea and vomiting.    Endocrine: Negative for cold intolerance, heat intolerance, polydipsia, polyphagia and polyuria.  Musculoskeletal: Negative.   Skin: Negative.   Neurological: Negative for dizziness, syncope, weakness, light-headedness, numbness and headaches.  Psychiatric/Behavioral: Negative.     Per HPI unless specifically indicated above     Objective:    BP 130/82   Pulse (!) 59   Temp 97.6 F (36.4 C) (Oral)   Wt 214 lb 8 oz (97.3 kg)   SpO2 96%   BMI 30.69 kg/m   Wt Readings from Last 3 Encounters:  09/15/18 214 lb 8 oz (97.3 kg)  07/10/18 213 lb (96.6 kg)  09/05/17 210 lb 3.2 oz (95.3 kg)    Physical Exam Vitals signs and nursing note reviewed.  Constitutional:      General: He is awake.     Appearance: He is well-developed. He is ill-appearing.  HENT:     Head: Normocephalic and atraumatic. No raccoon eyes.     Right Ear: Hearing, tympanic membrane, ear canal and external ear normal. No drainage.     Left Ear: Hearing, tympanic membrane, ear canal and external ear normal. No drainage.     Nose: Mucosal edema and rhinorrhea present. Rhinorrhea is purulent.     Right Sinus: Maxillary sinus tenderness present. No frontal sinus tenderness.     Left Sinus: Maxillary sinus tenderness present. No frontal sinus tenderness.     Mouth/Throat:     Lips: Pink.     Mouth: Mucous membranes are moist.     Pharynx: Uvula midline. Posterior oropharyngeal erythema (mild with cobblestoning) present. No pharyngeal swelling or oropharyngeal exudate.  Tonsils: Swelling: 0 on the right. 0 on the left.  Eyes:     General: Lids are normal.        Right eye: No discharge.        Left eye: No discharge.     Conjunctiva/sclera: Conjunctivae normal.     Pupils: Pupils are equal, round, and reactive to light.  Neck:     Musculoskeletal: Normal range of motion and neck supple.     Thyroid: No thyromegaly.     Vascular: No carotid bruit or JVD.     Trachea: Trachea normal.  Cardiovascular:      Rate and Rhythm: Normal rate and regular rhythm.     Heart sounds: Normal heart sounds, S1 normal and S2 normal. No murmur. No gallop.   Pulmonary:     Effort: Pulmonary effort is normal. No accessory muscle usage.     Breath sounds: Wheezing present.     Comments: Expiratory wheezes noted throughout.  No rhonchi. Abdominal:     General: Bowel sounds are normal.     Palpations: Abdomen is soft. There is no hepatomegaly or splenomegaly.  Genitourinary:    Penis: Normal.      Rectum: Normal.  Musculoskeletal: Normal range of motion.  Skin:    General: Skin is warm and dry.     Capillary Refill: Capillary refill takes less than 2 seconds.     Findings: No rash.  Neurological:     Mental Status: He is alert and oriented to person, place, and time.     Deep Tendon Reflexes: Reflexes are normal and symmetric.  Psychiatric:        Attention and Perception: Attention normal.        Mood and Affect: Mood normal.        Behavior: Behavior normal. Behavior is cooperative.        Thought Content: Thought content normal.        Judgment: Judgment normal.     Results for orders placed or performed in visit on 09/05/17  Lipid Panel w/o Chol/HDL Ratio  Result Value Ref Range   Cholesterol, Total 159 100 - 199 mg/dL   Triglycerides 97 0 - 149 mg/dL   HDL 35 (L) >39 mg/dL   VLDL Cholesterol Cal 19 5 - 40 mg/dL   LDL Calculated 105 (H) 0 - 99 mg/dL  CBC with Differential/Platelet  Result Value Ref Range   WBC 7.3 3.4 - 10.8 x10E3/uL   RBC 4.69 4.14 - 5.80 x10E6/uL   Hemoglobin 14.8 13.0 - 17.7 g/dL   Hematocrit 45.0 37.5 - 51.0 %   MCV 96 79 - 97 fL   MCH 31.6 26.6 - 33.0 pg   MCHC 32.9 31.5 - 35.7 g/dL   RDW 13.6 12.3 - 15.4 %   Platelets 249 150 - 379 x10E3/uL   Neutrophils 52 Not Estab. %   Lymphs 38 Not Estab. %   Monocytes 7 Not Estab. %   Eos 2 Not Estab. %   Basos 1 Not Estab. %   Neutrophils Absolute 3.8 1.4 - 7.0 x10E3/uL   Lymphocytes Absolute 2.8 0.7 - 3.1 x10E3/uL    Monocytes Absolute 0.5 0.1 - 0.9 x10E3/uL   EOS (ABSOLUTE) 0.2 0.0 - 0.4 x10E3/uL   Basophils Absolute 0.1 0.0 - 0.2 x10E3/uL   Immature Granulocytes 0 Not Estab. %   Immature Grans (Abs) 0.0 0.0 - 0.1 x10E3/uL  Comprehensive metabolic panel  Result Value Ref Range   Glucose 84 65 - 99  mg/dL   BUN 12 6 - 24 mg/dL   Creatinine, Ser 1.11 0.76 - 1.27 mg/dL   GFR calc non Af Amer 73 >59 mL/min/1.73   GFR calc Af Amer 85 >59 mL/min/1.73   BUN/Creatinine Ratio 11 9 - 20   Sodium 141 134 - 144 mmol/L   Potassium 4.5 3.5 - 5.2 mmol/L   Chloride 102 96 - 106 mmol/L   CO2 22 20 - 29 mmol/L   Calcium 9.5 8.7 - 10.2 mg/dL   Total Protein 6.5 6.0 - 8.5 g/dL   Albumin 4.2 3.5 - 5.5 g/dL   Globulin, Total 2.3 1.5 - 4.5 g/dL   Albumin/Globulin Ratio 1.8 1.2 - 2.2   Bilirubin Total 0.7 0.0 - 1.2 mg/dL   Alkaline Phosphatase 72 39 - 117 IU/L   AST 19 0 - 40 IU/L   ALT 21 0 - 44 IU/L  TSH  Result Value Ref Range   TSH 2.620 0.450 - 4.500 uIU/mL  PSA  Result Value Ref Range   Prostate Specific Ag, Serum 0.7 0.0 - 4.0 ng/mL      Assessment & Plan:   Problem List Items Addressed This Visit      Respiratory   Acute non-recurrent maxillary sinusitis - Primary    - CXR for ongoing cough x 2 weeks in smoker - Augmentin and Prednisone script sent. - Will use nebulizer he has at home, refuses inhaler Albuterol - Continue Mucinex and Tylenol at home as needed - Humidifier and increased fluid intake at home - Return for worsening or continued symptoms      Relevant Medications   amoxicillin-clavulanate (AUGMENTIN) 875-125 MG tablet   predniSONE (DELTASONE) 10 MG tablet   Other Relevant Orders   DG Chest 2 View       Follow up plan: Return if symptoms worsen or fail to improve.

## 2018-09-15 NOTE — Patient Instructions (Signed)

## 2018-09-15 NOTE — Assessment & Plan Note (Signed)
-   CXR for ongoing cough x 2 weeks in smoker - Augmentin and Prednisone script sent. - Will use nebulizer he has at home, refuses inhaler Albuterol - Continue Mucinex and Tylenol at home as needed - Humidifier and increased fluid intake at home - Return for worsening or continued symptoms

## 2018-09-16 ENCOUNTER — Other Ambulatory Visit: Payer: Self-pay | Admitting: Nurse Practitioner

## 2018-09-16 DIAGNOSIS — J189 Pneumonia, unspecified organism: Secondary | ICD-10-CM

## 2018-09-16 DIAGNOSIS — J181 Lobar pneumonia, unspecified organism: Principal | ICD-10-CM

## 2018-09-16 NOTE — Progress Notes (Signed)
Repeat CXR ordered for 4 weeks to assess current pneumonia and ensure no further consolidation noted.  Will have patient obtain before his 4 weeks f/u appointment.

## 2018-10-02 ENCOUNTER — Ambulatory Visit
Admission: RE | Admit: 2018-10-02 | Discharge: 2018-10-02 | Disposition: A | Discharge status: Home / Self Care | Payer: Medicare Other | Source: Ambulatory Visit | Attending: Nurse Practitioner | Admitting: Nurse Practitioner

## 2018-10-02 DIAGNOSIS — J181 Lobar pneumonia, unspecified organism: Secondary | ICD-10-CM | POA: Diagnosis not present

## 2018-10-02 DIAGNOSIS — J189 Pneumonia, unspecified organism: Secondary | ICD-10-CM

## 2018-10-02 DIAGNOSIS — R079 Chest pain, unspecified: Secondary | ICD-10-CM | POA: Diagnosis not present

## 2018-10-05 ENCOUNTER — Ambulatory Visit (INDEPENDENT_AMBULATORY_CARE_PROVIDER_SITE_OTHER): Payer: Medicare Other | Admitting: Nurse Practitioner

## 2018-10-05 ENCOUNTER — Encounter: Payer: Self-pay | Admitting: Nurse Practitioner

## 2018-10-05 VITALS — BP 128/76 | HR 63 | Temp 99.0°F | Ht 70.08 in | Wt 215.0 lb

## 2018-10-05 DIAGNOSIS — J0110 Acute frontal sinusitis, unspecified: Secondary | ICD-10-CM | POA: Insufficient documentation

## 2018-10-05 DIAGNOSIS — M1990 Unspecified osteoarthritis, unspecified site: Secondary | ICD-10-CM

## 2018-10-05 DIAGNOSIS — J0111 Acute recurrent frontal sinusitis: Secondary | ICD-10-CM | POA: Diagnosis not present

## 2018-10-05 DIAGNOSIS — I10 Essential (primary) hypertension: Secondary | ICD-10-CM | POA: Diagnosis not present

## 2018-10-05 DIAGNOSIS — Z720 Tobacco use: Secondary | ICD-10-CM

## 2018-10-05 DIAGNOSIS — Z23 Encounter for immunization: Secondary | ICD-10-CM | POA: Diagnosis not present

## 2018-10-05 DIAGNOSIS — Z1329 Encounter for screening for other suspected endocrine disorder: Secondary | ICD-10-CM | POA: Diagnosis not present

## 2018-10-05 DIAGNOSIS — E7800 Pure hypercholesterolemia, unspecified: Secondary | ICD-10-CM

## 2018-10-05 DIAGNOSIS — Z125 Encounter for screening for malignant neoplasm of prostate: Secondary | ICD-10-CM

## 2018-10-05 DIAGNOSIS — Z1159 Encounter for screening for other viral diseases: Secondary | ICD-10-CM | POA: Diagnosis not present

## 2018-10-05 DIAGNOSIS — K219 Gastro-esophageal reflux disease without esophagitis: Secondary | ICD-10-CM

## 2018-10-05 MED ORDER — DOXYCYCLINE HYCLATE 100 MG PO TABS: 100.0000 mg | ORAL_TABLET | Freq: Two times a day (BID) | ORAL | 0 refills | Status: DC

## 2018-10-05 MED ORDER — FLUTICASONE PROPIONATE 50 MCG/ACT NA SUSP: 2.0000 | Freq: Every day | NASAL | 6 refills | Status: DC

## 2018-10-05 NOTE — Assessment & Plan Note (Signed)
Acute, will send script for Doxycycline and Flonase.  He refuses to take OTC cold or antihistamine.  Recommend sinus flushes at home and increase hydration.  Smoking cessation would be beneficial.  If ongoing issues consider ENT referral.

## 2018-10-05 NOTE — Assessment & Plan Note (Signed)
Chronic, ongoing.  Continue current medication regimen.  BP below goal today.  CBC and CMP.

## 2018-10-05 NOTE — Patient Instructions (Signed)
DASH Eating Plan  DASH stands for "Dietary Approaches to Stop Hypertension." The DASH eating plan is a healthy eating plan that has been shown to reduce high blood pressure (hypertension). It may also reduce your risk for type 2 diabetes, heart disease, and stroke. The DASH eating plan may also help with weight loss.  What are tips for following this plan?    General guidelines   Avoid eating more than 2,300 mg (milligrams) of salt (sodium) a day. If you have hypertension, you may need to reduce your sodium intake to 1,500 mg a day.   Limit alcohol intake to no more than 1 drink a day for nonpregnant women and 2 drinks a day for men. One drink equals 12 oz of beer, 5 oz of wine, or 1 oz of hard liquor.   Work with your health care provider to maintain a healthy body weight or to lose weight. Ask what an ideal weight is for you.   Get at least 30 minutes of exercise that causes your heart to beat faster (aerobic exercise) most days of the week. Activities may include walking, swimming, or biking.   Work with your health care provider or diet and nutrition specialist (dietitian) to adjust your eating plan to your individual calorie needs.  Reading food labels     Check food labels for the amount of sodium per serving. Choose foods with less than 5 percent of the Daily Value of sodium. Generally, foods with less than 300 mg of sodium per serving fit into this eating plan.   To find whole grains, look for the word "whole" as the first word in the ingredient list.  Shopping   Buy products labeled as "low-sodium" or "no salt added."   Buy fresh foods. Avoid canned foods and premade or frozen meals.  Cooking   Avoid adding salt when cooking. Use salt-free seasonings or herbs instead of table salt or sea salt. Check with your health care provider or pharmacist before using salt substitutes.   Do not fry foods. Cook foods using healthy methods such as baking, boiling, grilling, and broiling instead.   Cook with  heart-healthy oils, such as olive, canola, soybean, or sunflower oil.  Meal planning   Eat a balanced diet that includes:  ? 5 or more servings of fruits and vegetables each day. At each meal, try to fill half of your plate with fruits and vegetables.  ? Up to 6-8 servings of whole grains each day.  ? Less than 6 oz of lean meat, poultry, or fish each day. A 3-oz serving of meat is about the same size as a deck of cards. One egg equals 1 oz.  ? 2 servings of low-fat dairy each day.  ? A serving of nuts, seeds, or beans 5 times each week.  ? Heart-healthy fats. Healthy fats called Omega-3 fatty acids are found in foods such as flaxseeds and coldwater fish, like sardines, salmon, and mackerel.   Limit how much you eat of the following:  ? Canned or prepackaged foods.  ? Food that is high in trans fat, such as fried foods.  ? Food that is high in saturated fat, such as fatty meat.  ? Sweets, desserts, sugary drinks, and other foods with added sugar.  ? Full-fat dairy products.   Do not salt foods before eating.   Try to eat at least 2 vegetarian meals each week.   Eat more home-cooked food and less restaurant, buffet, and fast food.     When eating at a restaurant, ask that your food be prepared with less salt or no salt, if possible.  What foods are recommended?  The items listed may not be a complete list. Talk with your dietitian about what dietary choices are best for you.  Grains  Whole-grain or whole-wheat bread. Whole-grain or whole-wheat pasta. Brown rice. Oatmeal. Quinoa. Bulgur. Whole-grain and low-sodium cereals. Pita bread. Low-fat, low-sodium crackers. Whole-wheat flour tortillas.  Vegetables  Fresh or frozen vegetables (raw, steamed, roasted, or grilled). Low-sodium or reduced-sodium tomato and vegetable juice. Low-sodium or reduced-sodium tomato sauce and tomato paste. Low-sodium or reduced-sodium canned vegetables.  Fruits  All fresh, dried, or frozen fruit. Canned fruit in natural juice (without  added sugar).  Meat and other protein foods  Skinless chicken or turkey. Ground chicken or turkey. Pork with fat trimmed off. Fish and seafood. Egg whites. Dried beans, peas, or lentils. Unsalted nuts, nut butters, and seeds. Unsalted canned beans. Lean cuts of beef with fat trimmed off. Low-sodium, lean deli meat.  Dairy  Low-fat (1%) or fat-free (skim) milk. Fat-free, low-fat, or reduced-fat cheeses. Nonfat, low-sodium ricotta or cottage cheese. Low-fat or nonfat yogurt. Low-fat, low-sodium cheese.  Fats and oils  Soft margarine without trans fats. Vegetable oil. Low-fat, reduced-fat, or light mayonnaise and salad dressings (reduced-sodium). Canola, safflower, olive, soybean, and sunflower oils. Avocado.  Seasoning and other foods  Herbs. Spices. Seasoning mixes without salt. Unsalted popcorn and pretzels. Fat-free sweets.  What foods are not recommended?  The items listed may not be a complete list. Talk with your dietitian about what dietary choices are best for you.  Grains  Baked goods made with fat, such as croissants, muffins, or some breads. Dry pasta or rice meal packs.  Vegetables  Creamed or fried vegetables. Vegetables in a cheese sauce. Regular canned vegetables (not low-sodium or reduced-sodium). Regular canned tomato sauce and paste (not low-sodium or reduced-sodium). Regular tomato and vegetable juice (not low-sodium or reduced-sodium). Pickles. Olives.  Fruits  Canned fruit in a light or heavy syrup. Fried fruit. Fruit in cream or butter sauce.  Meat and other protein foods  Fatty cuts of meat. Ribs. Fried meat. Bacon. Sausage. Bologna and other processed lunch meats. Salami. Fatback. Hotdogs. Bratwurst. Salted nuts and seeds. Canned beans with added salt. Canned or smoked fish. Whole eggs or egg yolks. Chicken or turkey with skin.  Dairy  Whole or 2% milk, cream, and half-and-half. Whole or full-fat cream cheese. Whole-fat or sweetened yogurt. Full-fat cheese. Nondairy creamers. Whipped toppings.  Processed cheese and cheese spreads.  Fats and oils  Butter. Stick margarine. Lard. Shortening. Ghee. Bacon fat. Tropical oils, such as coconut, palm kernel, or palm oil.  Seasoning and other foods  Salted popcorn and pretzels. Onion salt, garlic salt, seasoned salt, table salt, and sea salt. Worcestershire sauce. Tartar sauce. Barbecue sauce. Teriyaki sauce. Soy sauce, including reduced-sodium. Steak sauce. Canned and packaged gravies. Fish sauce. Oyster sauce. Cocktail sauce. Horseradish that you find on the shelf. Ketchup. Mustard. Meat flavorings and tenderizers. Bouillon cubes. Hot sauce and Tabasco sauce. Premade or packaged marinades. Premade or packaged taco seasonings. Relishes. Regular salad dressings.  Where to find more information:   National Heart, Lung, and Blood Institute: www.nhlbi.nih.gov   American Heart Association: www.heart.org  Summary   The DASH eating plan is a healthy eating plan that has been shown to reduce high blood pressure (hypertension). It may also reduce your risk for type 2 diabetes, heart disease, and stroke.   With the   DASH eating plan, you should limit salt (sodium) intake to 2,300 mg a day. If you have hypertension, you may need to reduce your sodium intake to 1,500 mg a day.   When on the DASH eating plan, aim to eat more fresh fruits and vegetables, whole grains, lean proteins, low-fat dairy, and heart-healthy fats.   Work with your health care provider or diet and nutrition specialist (dietitian) to adjust your eating plan to your individual calorie needs.  This information is not intended to replace advice given to you by your health care provider. Make sure you discuss any questions you have with your health care provider.  Document Released: 08/08/2011 Document Revised: 08/12/2016 Document Reviewed: 08/12/2016  [employer] Interactive Patient Education  2019 [employer]

## 2018-10-05 NOTE — Progress Notes (Signed)
BP 128/76   Pulse 63   Temp 99 F (37.2 C) (Oral)   Ht 5' 10.08" (1.78 m)   Wt 215 lb (97.5 kg)   SpO2 97%   BMI 30.78 kg/m    Subjective:    Patient ID: Hunter Huffman, male    DOB: December 04, 1960, 58 y.o.   MRN: [id number]  HPI: Hunter Huffman is a 58 y.o. male presenting on 10/05/2018 for comprehensive medical examination. Current medical complaints include:Continue URI symptoms  He currently lives with: mother Interim Problems from his last visit: continues URI symptoms   UPPER RESPIRATORY TRACT INFECTION On 09/15/2018 was treated for pneumonia, which on recent imaging has resolved.  Continues to have sinus pressure and discomfort + tinnitus.  He continues to smoke, 1/2 PPD.  He is not interested in quitting at this time.  States he has patches at home and he does not use them. Worst symptom: sinus pressure Fever: no Cough: no Shortness of breath: no Wheezing: no Chest pain: no Chest tightness: no Chest congestion: no Nasal congestion: yes Runny nose: yes Post nasal drip: yes Sneezing: yes Sore throat: no Swollen glands: no Sinus pressure: yes Headache: yes Face pain: yes Toothache: no Ear pain: none Ear pressure: yes bilateral Eyes red/itching:no Eye drainage/crusting: no  Vomiting: no Rash: no Fatigue: yes Sick contacts: no Strep contacts: no  Context: fluctuating Recurrent sinusitis: no Relief with OTC cold/cough medications: no  Treatments attempted: none   HYPERTENSION / HYPERLIPIDEMIA Continues to take HCTZ.  Took three days of Lipitor, but then stopped because it made him feel bad.  Has not taken this in over a year. Satisfied with current treatment? yes Duration of hypertension: chronic BP monitoring frequency: not checking BP range:  BP medication side effects: no Duration of hyperlipidemia: chronic Cholesterol medication side effects: no Cholesterol supplements: none Medication compliance: good compliance Aspirin: no Recent stressors:  no Recurrent headaches: no Visual changes: no Palpitations: no Dyspnea: no Chest pain: no Lower extremity edema: no Dizzy/lightheaded: no   GERD GERD control status: controlled  Satisfied with current treatment? yes Heartburn frequency:  Medication side effects: no  Medication compliance: stable Previous GERD medications: Antacid use frequency:   Duration:  Nature:  Location:  Heartburn duration:  Alleviatiating factors:   Aggravating factors:  Dysphagia: no Odynophagia:  no Hematemesis: no Blood in stool: no EGD: no  DEGENERATIVE ARTHRITIS: Followed by Dr. Jefm Bryant and last seen 07/22/18.  Continues on Enbrel.  Functional Status Survey: Is the patient deaf or have difficulty hearing?: No Does the patient have difficulty seeing, even when wearing glasses/contacts?: No Does the patient have difficulty concentrating, remembering, or making decisions?: No Does the patient have difficulty walking or climbing stairs?: No Does the patient have difficulty dressing or bathing?: No Does the patient have difficulty doing errands alone such as visiting a doctor's office or shopping?: No  FALL RISK: Fall Risk  10/05/2018 07/10/2018 09/05/2017  Falls in the past year? 0 0 No  Follow up Falls evaluation completed Falls evaluation completed -    Depression Screen Depression screen [facility] 2/9 10/05/2018 09/05/2017 09/03/2016  Decreased Interest 0 0 0  Down, Depressed, Hopeless 0 0 0  PHQ - 2 Score 0 0 0  Altered sleeping 0 0 -  Tired, decreased energy 1 1 -  Change in appetite 1 0 -  Feeling bad or failure about yourself  0 0 -  Trouble concentrating 0 0 -  Moving slowly or fidgety/restless 0 0 -  Suicidal thoughts 0 0 -  PHQ-9 Score 2 1 -  Difficult doing work/chores Not difficult at all - -    Advanced Directives <no information>  Past Medical History:  Past Medical History:  Diagnosis Date  . Hypertension   . Inflammatory arthritis     Surgical History:  Past Surgical  History:  Procedure Laterality Date  . HEMORRHOID SURGERY      Medications:  Current Outpatient Medications on File Prior to Visit  Medication Sig  . ENBREL SURECLICK 50 MG/ML injection Inject 50 mg into the skin once a week.   . hydrochlorothiazide (HYDRODIURIL) 25 MG tablet Take 12.5 mg by mouth daily.  . naproxen (NAPROSYN) 500 MG tablet Take 500 mg by mouth 2 (two) times daily.  Marland Kitchen omeprazole (PRILOSEC) 20 MG capsule Take 20 mg by mouth daily.  Marland Kitchen atorvastatin (LIPITOR) 20 MG tablet Take 1 tablet (20 mg total) by mouth daily. (Patient not taking: Reported on 09/15/2018)   No current facility-administered medications on file prior to visit.     Allergies:  No Known Allergies  Social History:  Social History   Socioeconomic History  . Marital status: Single    Spouse name: Not on file  . Number of children: Not on file  . Years of education: Not on file  . Highest education level: Not on file  Occupational History  . Not on file  Social Needs  . Financial resource strain: Not on file  . Food insecurity:    Worry: Not on file    Inability: Not on file  . Transportation needs:    Medical: Not on file    Non-medical: Not on file  Tobacco Use  . Smoking status: Current Every Day Smoker    Packs/day: 0.50    Types: Cigarettes  . Smokeless tobacco: Never Used  Substance and Sexual Activity  . Alcohol use: No    Comment: very rarely  . Drug use: No  . Sexual activity: Yes  Lifestyle  . Physical activity:    Days per week: Not on file    Minutes per session: Not on file  . Stress: Not on file  Relationships  . Social connections:    Talks on phone: Not on file    Gets together: Not on file    Attends religious service: Not on file    Active member of club or organization: Not on file    Attends meetings of clubs or organizations: Not on file    Relationship status: Not on file  . Intimate partner violence:    Fear of current or ex partner: Not on file     Emotionally abused: Not on file    Physically abused: Not on file    Forced sexual activity: Not on file  Other Topics Concern  . Not on file  Social History Narrative  . Not on file   Social History   Tobacco Use  Smoking Status Current Every Day Smoker  . Packs/day: 0.50  . Types: Cigarettes  Smokeless Tobacco Never Used   Social History   Substance and Sexual Activity  Alcohol Use No   Comment: very rarely    Family History:  Family History  Problem Relation Age of Onset  . Diabetes Mother   . Diabetes Father   . Hypertension Father   . Arthritis Father   . Diabetes Brother     Past medical history, surgical history, medications, allergies, family history and social history reviewed with patient today  and changes made to appropriate areas of the chart.   Review of Systems - Negative All other ROS negative except what is listed above and in the HPI.      Objective:    BP 128/76   Pulse 63   Temp 99 F (37.2 C) (Oral)   Ht 5' 10.08" (1.78 m)   Wt 215 lb (97.5 kg)   SpO2 97%   BMI 30.78 kg/m   Wt Readings from Last 3 Encounters:  10/05/18 215 lb (97.5 kg)  09/15/18 214 lb 8 oz (97.3 kg)  07/10/18 213 lb (96.6 kg)    Physical Exam Vitals signs and nursing note reviewed.  Constitutional:      General: He is awake.     Appearance: He is well-developed.  HENT:     Head: Normocephalic and atraumatic. No raccoon eyes.     Right Ear: Hearing, ear canal and external ear normal. No decreased hearing noted. No drainage. A middle ear effusion is present.     Left Ear: Hearing, ear canal and external ear normal. No decreased hearing noted. No drainage. A middle ear effusion is present.     Nose: Mucosal edema and rhinorrhea present. Rhinorrhea is purulent.     Right Sinus: Frontal sinus tenderness present. No maxillary sinus tenderness.     Left Sinus: Frontal sinus tenderness present. No maxillary sinus tenderness.     Mouth/Throat:     Mouth: Mucous membranes  are moist.     Pharynx: Uvula midline. Posterior oropharyngeal erythema (mild with cobblestoning) present. No pharyngeal swelling or oropharyngeal exudate.  Eyes:     General: Lids are normal.        Right eye: No discharge.        Left eye: No discharge.     Extraocular Movements: Extraocular movements intact.     Conjunctiva/sclera: Conjunctivae normal.     Pupils: Pupils are equal, round, and reactive to light.  Neck:     Musculoskeletal: Normal range of motion and neck supple.     Thyroid: No thyromegaly.     Vascular: No carotid bruit or JVD.     Trachea: Trachea normal.  Cardiovascular:     Rate and Rhythm: Normal rate and regular rhythm.     Heart sounds: Normal heart sounds, S1 normal and S2 normal. No murmur. No gallop.   Pulmonary:     Effort: Pulmonary effort is normal.     Breath sounds: Normal breath sounds.  Abdominal:     General: Bowel sounds are normal.     Palpations: Abdomen is soft. There is no hepatomegaly or splenomegaly.  Genitourinary:    Penis: Normal.      Rectum: Normal.  Musculoskeletal: Normal range of motion.     Right lower leg: Edema (1+) present.     Left lower leg: Edema (trace) present.  Lymphadenopathy:     Head:     Right side of head: No submental, submandibular, tonsillar or preauricular adenopathy.     Left side of head: No submental, submandibular, tonsillar or preauricular adenopathy.     Cervical: No cervical adenopathy.  Skin:    General: Skin is warm and dry.     Capillary Refill: Capillary refill takes less than 2 seconds.     Findings: No rash.  Neurological:     Mental Status: He is alert and oriented to person, place, and time.     Cranial Nerves: Cranial nerves are intact.     Motor: Motor function  is intact.     Coordination: Coordination is intact.     Gait: Gait is intact.     Deep Tendon Reflexes: Reflexes are normal and symmetric.     Reflex Scores:      Brachioradialis reflexes are 2+ on the right side and 2+ on the  left side.      Patellar reflexes are 2+ on the right side and 2+ on the left side. Psychiatric:        Attention and Perception: Attention normal.        Mood and Affect: Mood normal.        Speech: Speech normal.        Behavior: Behavior normal. Behavior is cooperative.        Thought Content: Thought content normal.        Cognition and Memory: Cognition normal.        Judgment: Judgment normal.      Results for orders placed or performed in visit on 09/05/17  Lipid Panel w/o Chol/HDL Ratio  Result Value Ref Range   Cholesterol, Total 159 100 - 199 mg/dL   Triglycerides 97 0 - 149 mg/dL   HDL 35 (L) >39 mg/dL   VLDL Cholesterol Cal 19 5 - 40 mg/dL   LDL Calculated 105 (H) 0 - 99 mg/dL  CBC with Differential/Platelet  Result Value Ref Range   WBC 7.3 3.4 - 10.8 x10E3/uL   RBC 4.69 4.14 - 5.80 x10E6/uL   Hemoglobin 14.8 13.0 - 17.7 g/dL   Hematocrit 45.0 37.5 - 51.0 %   MCV 96 79 - 97 fL   MCH 31.6 26.6 - 33.0 pg   MCHC 32.9 31.5 - 35.7 g/dL   RDW 13.6 12.3 - 15.4 %   Platelets 249 150 - 379 x10E3/uL   Neutrophils 52 Not Estab. %   Lymphs 38 Not Estab. %   Monocytes 7 Not Estab. %   Eos 2 Not Estab. %   Basos 1 Not Estab. %   Neutrophils Absolute 3.8 1.4 - 7.0 x10E3/uL   Lymphocytes Absolute 2.8 0.7 - 3.1 x10E3/uL   Monocytes Absolute 0.5 0.1 - 0.9 x10E3/uL   EOS (ABSOLUTE) 0.2 0.0 - 0.4 x10E3/uL   Basophils Absolute 0.1 0.0 - 0.2 x10E3/uL   Immature Granulocytes 0 Not Estab. %   Immature Grans (Abs) 0.0 0.0 - 0.1 x10E3/uL  Comprehensive metabolic panel  Result Value Ref Range   Glucose 84 65 - 99 mg/dL   BUN 12 6 - 24 mg/dL   Creatinine, Ser 1.11 0.76 - 1.27 mg/dL   GFR calc non Af Amer 73 >59 mL/min/1.73   GFR calc Af Amer 85 >59 mL/min/1.73   BUN/Creatinine Ratio 11 9 - 20   Sodium 141 134 - 144 mmol/L   Potassium 4.5 3.5 - 5.2 mmol/L   Chloride 102 96 - 106 mmol/L   CO2 22 20 - 29 mmol/L   Calcium 9.5 8.7 - 10.2 mg/dL   Total Protein 6.5 6.0 - 8.5 g/dL     Albumin 4.2 3.5 - 5.5 g/dL   Globulin, Total 2.3 1.5 - 4.5 g/dL   Albumin/Globulin Ratio 1.8 1.2 - 2.2   Bilirubin Total 0.7 0.0 - 1.2 mg/dL   Alkaline Phosphatase 72 39 - 117 IU/L   AST 19 0 - 40 IU/L   ALT 21 0 - 44 IU/L  TSH  Result Value Ref Range   TSH 2.620 0.450 - 4.500 uIU/mL  PSA  Result Value Ref Range  Prostate Specific Ag, Serum 0.7 0.0 - 4.0 ng/mL      Assessment & Plan:   Problem List Items Addressed This Visit      Cardiovascular and Mediastinum   Hypertension - Primary    Chronic, ongoing.  Continue current medication regimen.  BP below goal today.  CBC and CMP.      Relevant Orders   CBC with Differential/Platelet   Comprehensive metabolic panel     Respiratory   Sinusitis, acute frontal    Acute, will send script for Doxycycline and Flonase.  He refuses to take OTC cold or antihistamine.  Recommend sinus flushes at home and increase hydration.  Smoking cessation would be beneficial.  If ongoing issues consider ENT referral.      Relevant Medications   fluticasone (FLONASE) 50 MCG/ACT nasal spray   doxycycline (VIBRA-TABS) 100 MG tablet     Digestive   GERD (gastroesophageal reflux disease)    Chronic, ongoing.  Continue current medication regimen.  Mag level today.      Relevant Orders   Magnesium     Musculoskeletal and Integument   Degenerative arthritis    Followed by Dr. Jefm Bryant.        Other   Hypercholesteremia    Chronic, ongoing.  Refuses statin.  Lipid panel today.      Relevant Orders   Comprehensive metabolic panel   Lipid Panel w/o Chol/HDL Ratio   Tobacco abuse    I have recommended complete cessation of tobacco use. I have discussed various options available for assistance with tobacco cessation including over the counter methods (Nicotine gum, patch and lozenges). We also discussed prescription options (Chantix, Nicotine Inhaler / Nasal Spray). The patient is not interested in pursuing any prescription tobacco cessation  options at this time.       Other Visit Diagnoses    Prostate cancer screening       Relevant Orders   PSA   Thyroid disorder screen       Relevant Orders   TSH   Encounter for hepatitis C screening test for low risk patient       Relevant Orders   Hepatitis C antibody       Discussed aspirin prophylaxis for myocardial infarction prevention and decision was it was not indicated  LABORATORY TESTING:  Health maintenance labs ordered today as discussed above.   The natural history of prostate cancer and ongoing controversy regarding screening and potential treatment outcomes of prostate cancer has been discussed with the patient. The meaning of a false positive PSA and a false negative PSA has been discussed. He indicates understanding of the limitations of this screening test and wishes to proceed with screening PSA testing.   IMMUNIZATIONS:   - Tdap: Tetanus vaccination status reviewed: up to date - Influenza: Up to date - Pneumovax: Up to date - Prevnar: Up to date - Zostavax vaccine: Refused  SCREENING: - Colonoscopy: Refused  Discussed with patient purpose of the colonoscopy is to detect colon cancer at curable precancerous or early stages   - AAA Screening: Refused  -Hearing Test: Not applicable  -Spirometry: Refused   PATIENT COUNSELING:    Sexuality: Discussed sexually transmitted diseases, partner selection, use of condoms, avoidance of unintended pregnancy  and contraceptive alternatives.   Advised to avoid cigarette smoking.  I discussed with the patient that most people either abstain from alcohol or drink within safe limits (<=14/week and <=4 drinks/occasion for males, <=7/weeks and <= 3 drinks/occasion for females) and that  the risk for alcohol disorders and other health effects rises proportionally with the number of drinks per week and how often a drinker exceeds daily limits.  Discussed cessation/primary prevention of drug use and availability of  treatment for abuse.   Diet: Encouraged to adjust caloric intake to maintain  or achieve ideal body weight, to reduce intake of dietary saturated fat and total fat, to limit sodium intake by avoiding high sodium foods and not adding table salt, and to maintain adequate dietary potassium and calcium preferably from fresh fruits, vegetables, and low-fat dairy products.    stressed the importance of regular exercise  Injury prevention: Discussed safety belts, safety helmets, smoke detector, smoking near bedding or upholstery.   Dental health: Discussed importance of regular tooth brushing, flossing, and dental visits.   Follow up plan: NEXT PREVENTATIVE PHYSICAL DUE IN 1 YEAR. Return in about 6 months (around 04/05/2019) for HTN/HLD.

## 2018-10-05 NOTE — Assessment & Plan Note (Signed)
Chronic, ongoing.  Refuses statin.  Lipid panel today.

## 2018-10-05 NOTE — Assessment & Plan Note (Signed)
I have recommended complete cessation of tobacco use. I have discussed various options available for assistance with tobacco cessation including over the counter methods (Nicotine gum, patch and lozenges). We also discussed prescription options (Chantix, Nicotine Inhaler / Nasal Spray). The patient is not interested in pursuing any prescription tobacco cessation options at this time.  

## 2018-10-05 NOTE — Assessment & Plan Note (Signed)
Chronic, ongoing.  Continue current medication regimen.  Mag level today. 

## 2018-10-05 NOTE — Assessment & Plan Note (Signed)
Followed by Dr Kernodle.   

## 2018-10-06 LAB — LIPID PANEL W/O CHOL/HDL RATIO
CHOLESTEROL TOTAL: 150 mg/dL (ref 100–199)
HDL: 41 mg/dL (ref >39)
LDL Calculated: 94 mg/dL (ref 0–99)
TRIGLYCERIDES: 75 mg/dL (ref 0–149)
VLDL Cholesterol Cal: 15 mg/dL (ref 5–40)

## 2018-10-06 LAB — CBC WITH DIFFERENTIAL/PLATELET
BASOS ABS: 0.1 10*3/uL (ref 0.0–0.2)
BASOS: 1 %
EOS (ABSOLUTE): 0.2 10*3/uL (ref 0.0–0.4)
Eos: 3 %
Hematocrit: 45.6 % (ref 37.5–51.0)
Hemoglobin: 15.8 g/dL (ref 13.0–17.7)
Immature Grans (Abs): 0 10*3/uL (ref 0.0–0.1)
Immature Granulocytes: 0 %
Lymphocytes Absolute: 2.8 10*3/uL (ref 0.7–3.1)
Lymphs: 47 %
MCH: 32.6 pg (ref 26.6–33.0)
MCHC: 34.6 g/dL (ref 31.5–35.7)
MCV: 94 fL (ref 79–97)
MONOS ABS: 0.4 10*3/uL (ref 0.1–0.9)
Monocytes: 8 %
NEUTROS PCT: 41 %
Neutrophils Absolute: 2.4 10*3/uL (ref 1.4–7.0)
PLATELETS: 227 10*3/uL (ref 150–450)
RBC: 4.85 x10E6/uL (ref 4.14–5.80)
RDW: 12.6 % (ref 11.6–15.4)
WBC: 5.9 10*3/uL (ref 3.4–10.8)

## 2018-10-06 LAB — MAGNESIUM: Magnesium: 2 mg/dL (ref 1.6–2.3)

## 2018-10-06 LAB — HEPATITIS C ANTIBODY

## 2018-10-06 LAB — COMPREHENSIVE METABOLIC PANEL
ALT: 26 IU/L (ref 0–44)
AST: 22 IU/L (ref 0–40)
Albumin/Globulin Ratio: 2 (ref 1.2–2.2)
Albumin: 4.5 g/dL (ref 3.8–4.9)
Alkaline Phosphatase: 73 IU/L (ref 39–117)
BUN/Creatinine Ratio: 10 (ref 9–20)
BUN: 11 mg/dL (ref 6–24)
Bilirubin Total: 0.7 mg/dL (ref 0.0–1.2)
CO2: 25 mmol/L (ref 20–29)
Calcium: 9.9 mg/dL (ref 8.7–10.2)
Chloride: 100 mmol/L (ref 96–106)
Creatinine, Ser: 1.11 mg/dL (ref 0.76–1.27)
GFR calc Af Amer: 84 mL/min/{1.73_m2} (ref >59)
GFR calc non Af Amer: 73 mL/min/{1.73_m2} (ref >59)
Globulin, Total: 2.3 g/dL (ref 1.5–4.5)
Glucose: 93 mg/dL (ref 65–99)
Potassium: 4.8 mmol/L (ref 3.5–5.2)
Sodium: 139 mmol/L (ref 134–144)
Total Protein: 6.8 g/dL (ref 6.0–8.5)

## 2018-10-06 LAB — PSA: Prostate Specific Ag, Serum: 1 ng/mL (ref 0.0–4.0)

## 2018-10-06 LAB — TSH: TSH: 2.72 u[IU]/mL (ref 0.450–4.500)

## 2019-04-05 ENCOUNTER — Ambulatory Visit: Payer: Self-pay | Admitting: Nurse Practitioner

## 2019-04-22 DIAGNOSIS — Z79899 Other long term (current) drug therapy: Secondary | ICD-10-CM | POA: Diagnosis not present

## 2019-04-22 DIAGNOSIS — M25531 Pain in right wrist: Secondary | ICD-10-CM | POA: Diagnosis not present

## 2019-04-22 DIAGNOSIS — M1990 Unspecified osteoarthritis, unspecified site: Secondary | ICD-10-CM | POA: Diagnosis not present

## 2019-10-18 DIAGNOSIS — G8929 Other chronic pain: Secondary | ICD-10-CM | POA: Diagnosis not present

## 2019-10-18 DIAGNOSIS — M25571 Pain in right ankle and joints of right foot: Secondary | ICD-10-CM | POA: Diagnosis not present

## 2019-10-18 DIAGNOSIS — M1990 Unspecified osteoarthritis, unspecified site: Secondary | ICD-10-CM | POA: Diagnosis not present

## 2020-07-17 DIAGNOSIS — M25571 Pain in right ankle and joints of right foot: Secondary | ICD-10-CM | POA: Diagnosis not present

## 2020-07-17 DIAGNOSIS — R059 Cough, unspecified: Secondary | ICD-10-CM | POA: Diagnosis not present

## 2020-07-17 DIAGNOSIS — J90 Pleural effusion, not elsewhere classified: Secondary | ICD-10-CM | POA: Diagnosis not present

## 2020-07-17 DIAGNOSIS — M1990 Unspecified osteoarthritis, unspecified site: Secondary | ICD-10-CM | POA: Diagnosis not present

## 2020-07-17 DIAGNOSIS — M25531 Pain in right wrist: Secondary | ICD-10-CM | POA: Diagnosis not present

## 2020-07-17 DIAGNOSIS — R053 Chronic cough: Secondary | ICD-10-CM | POA: Diagnosis not present

## 2020-07-17 DIAGNOSIS — G8929 Other chronic pain: Secondary | ICD-10-CM | POA: Diagnosis not present

## 2021-02-25 DIAGNOSIS — Z20822 Contact with and (suspected) exposure to covid-19: Secondary | ICD-10-CM | POA: Diagnosis not present

## 2021-04-16 DIAGNOSIS — M25571 Pain in right ankle and joints of right foot: Secondary | ICD-10-CM | POA: Diagnosis not present

## 2021-04-16 DIAGNOSIS — M159 Polyosteoarthritis, unspecified: Secondary | ICD-10-CM | POA: Diagnosis not present

## 2021-04-16 DIAGNOSIS — G8929 Other chronic pain: Secondary | ICD-10-CM | POA: Diagnosis not present

## 2021-04-16 DIAGNOSIS — M25531 Pain in right wrist: Secondary | ICD-10-CM | POA: Diagnosis not present

## 2021-04-16 DIAGNOSIS — Z79899 Other long term (current) drug therapy: Secondary | ICD-10-CM | POA: Diagnosis not present

## 2021-09-05 ENCOUNTER — Encounter: Payer: Self-pay | Admitting: Nurse Practitioner

## 2021-09-05 ENCOUNTER — Ambulatory Visit
Admission: RE | Admit: 2021-09-05 | Discharge: 2021-09-05 | Disposition: A | Discharge status: Home / Self Care | Payer: Medicare Other | Source: Ambulatory Visit | Attending: Nurse Practitioner | Admitting: Nurse Practitioner

## 2021-09-05 ENCOUNTER — Other Ambulatory Visit: Payer: Self-pay

## 2021-09-05 ENCOUNTER — Ambulatory Visit (INDEPENDENT_AMBULATORY_CARE_PROVIDER_SITE_OTHER): Payer: Medicare Other | Admitting: Nurse Practitioner

## 2021-09-05 ENCOUNTER — Ambulatory Visit
Admission: RE | Admit: 2021-09-05 | Discharge: 2021-09-05 | Disposition: A | Discharge status: Home / Self Care | Payer: Medicare Other | Attending: Nurse Practitioner | Admitting: Nurse Practitioner

## 2021-09-05 VITALS — BP 137/73 | HR 68 | Temp 98.5°F | Ht 70.0 in | Wt 211.8 lb

## 2021-09-05 DIAGNOSIS — E7800 Pure hypercholesterolemia, unspecified: Secondary | ICD-10-CM

## 2021-09-05 DIAGNOSIS — Z114 Encounter for screening for human immunodeficiency virus [HIV]: Secondary | ICD-10-CM

## 2021-09-05 DIAGNOSIS — R0789 Other chest pain: Secondary | ICD-10-CM | POA: Insufficient documentation

## 2021-09-05 DIAGNOSIS — F17210 Nicotine dependence, cigarettes, uncomplicated: Secondary | ICD-10-CM

## 2021-09-05 DIAGNOSIS — R0602 Shortness of breath: Secondary | ICD-10-CM | POA: Diagnosis not present

## 2021-09-05 DIAGNOSIS — M1990 Unspecified osteoarthritis, unspecified site: Secondary | ICD-10-CM | POA: Diagnosis not present

## 2021-09-05 DIAGNOSIS — R9431 Abnormal electrocardiogram [ECG] [EKG]: Secondary | ICD-10-CM | POA: Diagnosis not present

## 2021-09-05 DIAGNOSIS — I10 Essential (primary) hypertension: Secondary | ICD-10-CM | POA: Diagnosis not present

## 2021-09-05 DIAGNOSIS — R059 Cough, unspecified: Secondary | ICD-10-CM | POA: Diagnosis not present

## 2021-09-05 DIAGNOSIS — R053 Chronic cough: Secondary | ICD-10-CM | POA: Diagnosis not present

## 2021-09-05 DIAGNOSIS — Z0000 Encounter for general adult medical examination without abnormal findings: Secondary | ICD-10-CM

## 2021-09-05 DIAGNOSIS — K219 Gastro-esophageal reflux disease without esophagitis: Secondary | ICD-10-CM

## 2021-09-05 DIAGNOSIS — Z833 Family history of diabetes mellitus: Secondary | ICD-10-CM

## 2021-09-05 DIAGNOSIS — J439 Emphysema, unspecified: Secondary | ICD-10-CM | POA: Insufficient documentation

## 2021-09-05 DIAGNOSIS — Z23 Encounter for immunization: Secondary | ICD-10-CM

## 2021-09-05 DIAGNOSIS — N400 Enlarged prostate without lower urinary tract symptoms: Secondary | ICD-10-CM

## 2021-09-05 DIAGNOSIS — J449 Chronic obstructive pulmonary disease, unspecified: Secondary | ICD-10-CM | POA: Diagnosis not present

## 2021-09-05 MED ORDER — HYDROCHLOROTHIAZIDE 25 MG PO TABS: 12.5000 mg | ORAL_TABLET | Freq: Every day | ORAL | 4 refills | Status: DC

## 2021-09-05 MED ORDER — SHINGRIX 50 MCG/0.5ML IM SUSR: 0.5000 mL | Freq: Once | INTRAMUSCULAR | 0 refills | Status: AC

## 2021-09-05 MED ORDER — AMOXICILLIN-POT CLAVULANATE 875-125 MG PO TABS: 1.0000 | ORAL_TABLET | Freq: Two times a day (BID) | ORAL | 0 refills | Status: AC

## 2021-09-05 MED ORDER — PREDNISONE 20 MG PO TABS: 40.0000 mg | ORAL_TABLET | Freq: Every day | ORAL | 0 refills | Status: AC

## 2021-09-05 NOTE — Progress Notes (Signed)
Good morning crew, I will message with patient labs upon return, but please call and alert patient that his imaging has returned and shows no pneumonia or other acute concerns.  It does however noted emphysema, which I discussed with him.  This is most likely cause of this new chronic cough, complete antibiotic and Prednisone.  At next visit we will test lungs and most likely start a maintenance inhaler to slow down progression of disease and improve symptoms.  Any questions? Keep being awesome!!  Thank you for allowing me to participate in your care.  I appreciate you. Kindest regards, Zsofia Prout

## 2021-09-05 NOTE — Assessment & Plan Note (Signed)
Chronic, ongoing with history of poor tolerance to Atorvastatin.  Recheck lipid panel today, have low threshold for starting statin, which discussed with patient.  Discussed trial of Rosuvastatin, which he may tolerate better, if ongoing elevation of LDL.  Will further discuss upon return of labs.

## 2021-09-05 NOTE — Assessment & Plan Note (Signed)
Check A1c today and continue to monitor closely. 

## 2021-09-05 NOTE — Assessment & Plan Note (Signed)
I have recommended complete cessation of tobacco use. I have discussed various options available for assistance with tobacco cessation including over the counter methods (Nicotine gum, patch and lozenges). We also discussed prescription options (Chantix, Nicotine Inhaler / Nasal Spray). The patient is not interested in pursuing any prescription tobacco cessation options at this time.  Referral to lung CT CA screening program.

## 2021-09-05 NOTE — Progress Notes (Signed)
BP 137/73    Pulse 68    Temp 98.5 F (36.9 C) (Oral)    Ht 5\' 10"  (1.778 m)    Wt 211 lb 12.8 oz (96.1 kg)    SpO2 97%    BMI 30.39 kg/m    Subjective:    Patient ID: Hunter Huffman, male    DOB: 12-15-1960, 61 y.o.   MRN: [id number]  HPI: Hunter Huffman is a 61 y.o. male presenting on 09/05/2021 for comprehensive medical examination. Current medical complaints include:none  He currently lives with: significant other Interim Problems from his last visit: no  Continues on Enbrel for inflammatory arthritis -- follows with Dr. Jefm Bryant and last seen 04/16/21.  COUGH Has lingering persistent cough that has been present for 6 months -- lots of clearing throat and phlegm is thick, clears up as day goes on. Is feeling more SOB with walking to mailbox, 100 feet. No issues with walking around house.  Does have occasional chest tightness.  Is a smoker, smokes 3/4 PPD, use to smoke 1 PPD -- is working on cutting back.  Has smoked since he was 61 years old.   Duration: months Circumstances of initial development of cough:  kind of goes through colds but does not pay them attention Cough severity: moderate Cough description: productive and irritating Aggravating factors:  worse in the AM Alleviating factors: nothing Status:  stable Treatments attempted: none Wheezing:  occasional, clears throat and goes away Shortness of breath:  as noted above == sleeps on one pillow at night at baseline Chest pain: no Chest tightness:yes Nasal congestion: yes Runny nose: yes Postnasal drip: yes Frequent throat clearing or swallowing: yes Hemoptysis: no Fevers: no Night sweats: no Weight loss: no Heartburn: no Recent foreign travel: no Tuberculosis contacts: no   HYPERTENSION / HYPERLIPIDEMIA Took Atorvastatin in past which made him feel bad. Continues on HCTZ 12.5 MG daily. Satisfied with current treatment? yes Duration of hypertension: chronic BP monitoring frequency: not checking BP range:   BP medication side effects: no Duration of hyperlipidemia: chronic Cholesterol medication side effects: yes Cholesterol supplements: none Aspirin: no Recent stressors: no Recurrent headaches: no Visual changes: no Palpitations: no Dyspnea: no Chest pain: no Lower extremity edema: no Dizzy/lightheaded: no  The 10-year ASCVD risk score (Arnett DK, et al., 2019) is: 16.7%   Values used to calculate the score:     Age: 18 years     Sex: Male     Is Non-Hispanic African American: No     Diabetic: No     Tobacco smoker: Yes     Systolic Blood Pressure: 287 mmHg     Is BP treated: Yes     HDL Cholesterol: 41 mg/dL     Total Cholesterol: 150 mg/dL  GERD Continues on Prilosec to keep GI safe with Naproxen use. GERD control status: stable Satisfied with current treatment? yes Dysphagia: no Odynophagia:  no Hematemesis: no Blood in stool: no EGD: no   Functional Status Survey: Is the patient deaf or have difficulty hearing?: No Does the patient have difficulty seeing, even when wearing glasses/contacts?: No Does the patient have difficulty concentrating, remembering, or making decisions?: No Does the patient have difficulty walking or climbing stairs?: No Does the patient have difficulty dressing or bathing?: No Does the patient have difficulty doing errands alone such as visiting a doctor's office or shopping?: No  FALL RISK: Fall Risk  09/05/2021 10/05/2018 07/10/2018 09/05/2017  Falls in the past year? 0 0 0  No  Number falls in past yr: 0 - - -  Injury with Fall? 0 - - -  Risk for fall due to : No Fall Risks - - -  Follow up Falls evaluation completed Falls evaluation completed Falls evaluation completed -    Depression Screen Depression screen [facility] 2/9 09/05/2021 09/05/2021 10/05/2018 09/05/2017 09/03/2016  Decreased Interest 0 0 0 0 0  Down, Depressed, Hopeless 0 0 0 0 0  PHQ - 2 Score 0 0 0 0 0  Altered sleeping 0 - 0 0 -  Tired, decreased energy 2 - 1 1 -  Change in appetite 0  - 1 0 -  Feeling bad or failure about yourself  0 - 0 0 -  Trouble concentrating 0 - 0 0 -  Moving slowly or fidgety/restless 0 - 0 0 -  Suicidal thoughts 0 - 0 0 -  PHQ-9 Score 2 - 2 1 -  Difficult doing work/chores Not difficult at all - Not difficult at all - -    Advanced Directives <no information>  Past Medical History:  Past Medical History:  Diagnosis Date   Hypertension    Inflammatory arthritis     Surgical History:  Past Surgical History:  Procedure Laterality Date   HEMORRHOID SURGERY      Medications:  Current Outpatient Medications on File Prior to Visit  Medication Sig   ENBREL SURECLICK 50 MG/ML injection Inject 50 mg into the skin once a week.    naproxen (NAPROSYN) 500 MG tablet Take 500 mg by mouth 2 (two) times daily.   omeprazole (PRILOSEC) 20 MG capsule Take 20 mg by mouth daily.   fluticasone (FLONASE) 50 MCG/ACT nasal spray Place 2 sprays into both nostrils daily. (Patient not taking: Reported on 09/05/2021)   No current facility-administered medications on file prior to visit.    Allergies:  No Known Allergies  Social History:  Social History   Socioeconomic History   Marital status: Single    Spouse name: Not on file   Number of children: Not on file   Years of education: Not on file   Highest education level: Not on file  Occupational History   Not on file  Tobacco Use   Smoking status: Every Day    Packs/day: 0.50    Types: Cigarettes   Smokeless tobacco: Never  Vaping Use   Vaping Use: Never used  Substance and Sexual Activity   Alcohol use: No    Comment: very rarely   Drug use: No   Sexual activity: Yes  Other Topics Concern   Not on file  Social History Narrative   Not on file   Social Determinants of Health   Financial Resource Strain: Not on file  Food Insecurity: Not on file  Transportation Needs: Not on file  Physical Activity: Not on file  Stress: Not on file  Social Connections: Not on file  Intimate  Partner Violence: Not on file   Social History   Tobacco Use  Smoking Status Every Day   Packs/day: 0.50   Types: Cigarettes  Smokeless Tobacco Never   Social History   Substance and Sexual Activity  Alcohol Use No   Comment: very rarely    Family History:  Family History  Problem Relation Age of Onset   Diabetes Mother    Diabetes Father    Hypertension Father    Arthritis Father    Diabetes Brother     Past medical history, surgical history, medications, allergies, family  history and social history reviewed with patient today and changes made to appropriate areas of the chart.   ROS All other ROS negative except what is listed above and in the HPI.      Objective:    BP 137/73    Pulse 68    Temp 98.5 F (36.9 C) (Oral)    Ht 5\' 10"  (1.778 m)    Wt 211 lb 12.8 oz (96.1 kg)    SpO2 97%    BMI 30.39 kg/m   Wt Readings from Last 3 Encounters:  09/05/21 211 lb 12.8 oz (96.1 kg)  10/05/18 215 lb (97.5 kg)  09/15/18 214 lb 8 oz (97.3 kg)    No results found.  Physical Exam Vitals and nursing note reviewed.  Constitutional:      General: He is awake. He is not in acute distress.    Appearance: He is well-developed and well-groomed. He is obese. He is not ill-appearing or toxic-appearing.  HENT:     Head: Normocephalic and atraumatic.     Right Ear: Hearing, tympanic membrane, ear canal and external ear normal. No drainage.     Left Ear: Hearing, tympanic membrane, ear canal and external ear normal. No drainage.     Nose: Nose normal.     Mouth/Throat:     Pharynx: Uvula midline.  Eyes:     General: Lids are normal.        Right eye: No discharge.        Left eye: No discharge.     Extraocular Movements: Extraocular movements intact.     Conjunctiva/sclera: Conjunctivae normal.     Pupils: Pupils are equal, round, and reactive to light.     Visual Fields: Right eye visual fields normal and left eye visual fields normal.  Neck:     Thyroid: No thyromegaly.      Vascular: No carotid bruit or JVD.     Trachea: Trachea normal.  Cardiovascular:     Rate and Rhythm: Normal rate and regular rhythm.     Heart sounds: Normal heart sounds, S1 normal and S2 normal. No murmur heard.   No gallop.  Pulmonary:     Effort: Pulmonary effort is normal. No accessory muscle usage or respiratory distress.     Breath sounds: Wheezing present. No rhonchi.     Comments: Expiratory wheezes noted throughout. Intermittent cough present.  No SOB with talking. Abdominal:     General: Bowel sounds are normal.     Palpations: Abdomen is soft. There is no hepatomegaly or splenomegaly.     Tenderness: There is no abdominal tenderness.  Musculoskeletal:        General: Normal range of motion.     Cervical back: Normal range of motion and neck supple.     Right lower leg: No edema.     Left lower leg: No edema.  Lymphadenopathy:     Head:     Right side of head: No submental, submandibular, tonsillar, preauricular or posterior auricular adenopathy.     Left side of head: No submental, submandibular, tonsillar, preauricular or posterior auricular adenopathy.     Cervical: No cervical adenopathy.  Skin:    General: Skin is warm and dry.     Capillary Refill: Capillary refill takes less than 2 seconds.     Findings: No rash.  Neurological:     Mental Status: He is alert and oriented to person, place, and time.     Gait: Gait is intact.  Deep Tendon Reflexes: Reflexes are normal and symmetric.     Reflex Scores:      Brachioradialis reflexes are 2+ on the right side and 2+ on the left side.      Patellar reflexes are 2+ on the right side and 2+ on the left side. Psychiatric:        Attention and Perception: Attention normal.        Mood and Affect: Mood normal.        Speech: Speech normal.        Behavior: Behavior normal. Behavior is cooperative.        Thought Content: Thought content normal.        Cognition and Memory: Cognition normal.        Judgment:  Judgment normal.   EKG My review and personal interpretation at Time: 1420   Indication: chest tightness  Rate: 66 Rhythm: sinus Axis: normal Other: no nonspecific st abn, no stemi, no lvh, 1st degree A-V block - artifact noted, RBBB  Results for orders placed or performed in visit on 10/05/18  CBC with Differential/Platelet  Result Value Ref Range   WBC 5.9 3.4 - 10.8 x10E3/uL   RBC 4.85 4.14 - 5.80 x10E6/uL   Hemoglobin 15.8 13.0 - 17.7 g/dL   Hematocrit 45.6 37.5 - 51.0 %   MCV 94 79 - 97 fL   MCH 32.6 26.6 - 33.0 pg   MCHC 34.6 31.5 - 35.7 g/dL   RDW 12.6 11.6 - 15.4 %   Platelets 227 150 - 450 x10E3/uL   Neutrophils 41 Not Estab. %   Lymphs 47 Not Estab. %   Monocytes 8 Not Estab. %   Eos 3 Not Estab. %   Basos 1 Not Estab. %   Neutrophils Absolute 2.4 1.4 - 7.0 x10E3/uL   Lymphocytes Absolute 2.8 0.7 - 3.1 x10E3/uL   Monocytes Absolute 0.4 0.1 - 0.9 x10E3/uL   EOS (ABSOLUTE) 0.2 0.0 - 0.4 x10E3/uL   Basophils Absolute 0.1 0.0 - 0.2 x10E3/uL   Immature Granulocytes 0 Not Estab. %   Immature Grans (Abs) 0.0 0.0 - 0.1 x10E3/uL  Comprehensive metabolic panel  Result Value Ref Range   Glucose 93 65 - 99 mg/dL   BUN 11 6 - 24 mg/dL   Creatinine, Ser 1.11 0.76 - 1.27 mg/dL   GFR calc non Af Amer 73 >59 mL/min/1.73   GFR calc Af Amer 84 >59 mL/min/1.73   BUN/Creatinine Ratio 10 9 - 20   Sodium 139 134 - 144 mmol/L   Potassium 4.8 3.5 - 5.2 mmol/L   Chloride 100 96 - 106 mmol/L   CO2 25 20 - 29 mmol/L   Calcium 9.9 8.7 - 10.2 mg/dL   Total Protein 6.8 6.0 - 8.5 g/dL   Albumin 4.5 3.8 - 4.9 g/dL   Globulin, Total 2.3 1.5 - 4.5 g/dL   Albumin/Globulin Ratio 2.0 1.2 - 2.2   Bilirubin Total 0.7 0.0 - 1.2 mg/dL   Alkaline Phosphatase 73 39 - 117 IU/L   AST 22 0 - 40 IU/L   ALT 26 0 - 44 IU/L  Hepatitis C antibody  Result Value Ref Range   Hep C Virus Ab <0.1 0.0 - 0.9 s/co ratio  Lipid Panel w/o Chol/HDL Ratio  Result Value Ref Range   Cholesterol, Total 150 100 - 199  mg/dL   Triglycerides 75 0 - 149 mg/dL   HDL 41 >39 mg/dL   VLDL Cholesterol Cal 15 5 - 40 mg/dL  LDL Calculated 94 0 - 99 mg/dL  PSA  Result Value Ref Range   Prostate Specific Ag, Serum 1.0 0.0 - 4.0 ng/mL  TSH  Result Value Ref Range   TSH 2.720 0.450 - 4.500 uIU/mL  Magnesium  Result Value Ref Range   Magnesium 2.0 1.6 - 2.3 mg/dL      Assessment & Plan:   Problem List Items Addressed This Visit       Cardiovascular and Mediastinum   Hypertension - Primary    Chronic, ongoing with BP at goal in office today.  Recommend he monitor BP at least a few mornings a week at home and document.  DASH diet at home.  Continue current medication regimen and adjust as needed.  Labs today: CBC, CMP, TSH today.  Refills sent in.       Relevant Medications   hydrochlorothiazide (HYDRODIURIL) 25 MG tablet   Other Relevant Orders   CBC with Differential/Platelet   Comprehensive metabolic panel   TSH     Digestive   GERD (gastroesophageal reflux disease)    Chronic, stable, continues Prilosec for GI protection with his inflammatory arthritis and Naproxen use.  Continue this and check Mag level today.      Relevant Orders   Magnesium     Musculoskeletal and Integument   Chronic inflammatory arthritis    Chronic, followed by Dr. Jefm Bryant with rheumatology, continue this collaboration and medications as ordered by them.      Relevant Medications   predniSONE (DELTASONE) 20 MG tablet     Other   Chest tightness    Reported with ongoing chronic cough x 6 months.  Suspect underlying COPD in a long time smoker, however EKG with some abnormal findings today.  Will place referral to cardiology for further work-up and reassurance + obtain echo.  Check labs today: CBC, CMP, TSH, A1c.  CXR ordered.  Return in 6 weeks for further assessment, including spirometry.      Relevant Orders   EKG 12-Lead (Completed)   Ambulatory referral to Cardiology   ECHOCARDIOGRAM COMPLETE   Chronic cough     Ongoing for 6 months with some SOB increased -- suspect underlying emphysema in long time smoker.  At this time obtain CXR.  Start Augmentin for 7 days and a Prednisone burst.  May take OTC Mucinex as needed.  Recommend use of Albuterol inhaler, which he declines at this time.  Referral for CT lung screening placed.  Plan on return in 6 weeks for spirometry.      Relevant Orders   DG Chest 2 View (Completed)   EKG 12-Lead (Completed)   Family history of diabetes mellitus (DM)    Check A1c today and continue to monitor closely.      Relevant Orders   HgB A1c   Hypercholesteremia    Chronic, ongoing with history of poor tolerance to Atorvastatin.  Recheck lipid panel today, have low threshold for starting statin, which discussed with patient.  Discussed trial of Rosuvastatin, which he may tolerate better, if ongoing elevation of LDL.  Will further discuss upon return of labs.        Relevant Medications   hydrochlorothiazide (HYDRODIURIL) 25 MG tablet   Other Relevant Orders   Comprehensive metabolic panel   Lipid Panel w/o Chol/HDL Ratio   Nicotine dependence, cigarettes, uncomplicated    I have recommended complete cessation of tobacco use. I have discussed various options available for assistance with tobacco cessation including over the counter methods (Nicotine gum,  patch and lozenges). We also discussed prescription options (Chantix, Nicotine Inhaler / Nasal Spray). The patient is not interested in pursuing any prescription tobacco cessation options at this time.  Referral to lung CT CA screening program.       Relevant Orders   Ambulatory Referral Lung Cancer Screening [facility]   Other Visit Diagnoses     Abnormal EKG       Referral to cardiology placed.   Relevant Orders   Ambulatory referral to Cardiology   ECHOCARDIOGRAM COMPLETE   Benign prostatic hyperplasia without lower urinary tract symptoms       PSA check on labs today.   Relevant Orders   PSA    Encounter for screening for HIV       HIV screening x one on labs today.   Relevant Orders   HIV Antibody (routine testing w rflx)   Need for shingles vaccine       Shingrix vaccines ordered to pharmacy today.   Relevant Medications   Zoster Vaccine Adjuvanted [facility]) injection   Zoster Vaccine Adjuvanted [facility] injection (Start on 11/14/2021)   Pneumococcal vaccination given       PPSV23 provided in office today.   Relevant Orders   Pneumococcal polysaccharide vaccine 23-valent greater than or equal to 2yo subcutaneous/IM (Completed)   Encounter for annual physical exam       Annual physical with labs today.  Health maintenance reviewed with patient.       Discussed aspirin prophylaxis for myocardial infarction prevention and decision was it was not indicated  LABORATORY TESTING:  Health maintenance labs ordered today as discussed above.   The natural history of prostate cancer and ongoing controversy regarding screening and potential treatment outcomes of prostate cancer has been discussed with the patient. The meaning of a false positive PSA and a false negative PSA has been discussed. He indicates understanding of the limitations of this screening test and wishes to proceed with screening PSA testing.   IMMUNIZATIONS:   - Tdap: Tetanus vaccination status reviewed: last tetanus booster within 10 years. - Influenza: Up to date - Pneumovax: Administered today - Prevnar: Not applicable - Zostavax vaccine:  Ordered Today  SCREENING: - Colonoscopy: Refused -- refuses Cologuard as well Discussed with patient purpose of the colonoscopy is to detect colon cancer at curable precancerous or early stages   - AAA Screening: Not applicable -- attain at age 77 -Hearing Test: Not applicable  -Spirometry: obtain next visit  PATIENT COUNSELING:    Sexuality: Discussed sexually transmitted diseases, partner selection, use of condoms, avoidance of unintended pregnancy  and  contraceptive alternatives.   Advised to avoid cigarette smoking.  I discussed with the patient that most people either abstain from alcohol or drink within safe limits (<=14/week and <=4 drinks/occasion for males, <=7/weeks and <= 3 drinks/occasion for females) and that the risk for alcohol disorders and other health effects rises proportionally with the number of drinks per week and how often a drinker exceeds daily limits.  Discussed cessation/primary prevention of drug use and availability of treatment for abuse.   Diet: Encouraged to adjust caloric intake to maintain  or achieve ideal body weight, to reduce intake of dietary saturated fat and total fat, to limit sodium intake by avoiding high sodium foods and not adding table salt, and to maintain adequate dietary potassium and calcium preferably from fresh fruits, vegetables, and low-fat dairy products.    Stressed the importance of regular exercise  Injury prevention: Discussed safety belts, safety  helmets, smoke detector, smoking near bedding or upholstery.   Dental health: Discussed importance of regular tooth brushing, flossing, and dental visits.   Follow up plan: NEXT PREVENTATIVE PHYSICAL DUE IN 1 YEAR. Return in about 6 weeks (around 10/17/2021) for COPD with spirometry.

## 2021-09-05 NOTE — Assessment & Plan Note (Signed)
Ongoing for 6 months with some SOB increased -- suspect underlying emphysema in long time smoker.  At this time obtain CXR.  Start Augmentin for 7 days and a Prednisone burst.  May take OTC Mucinex as needed.  Recommend use of Albuterol inhaler, which he declines at this time.  Referral for CT lung screening placed.  Plan on return in 6 weeks for spirometry.

## 2021-09-05 NOTE — Assessment & Plan Note (Signed)
Reported with ongoing chronic cough x 6 months.  Suspect underlying COPD in a long time smoker, however EKG with some abnormal findings today.  Will place referral to cardiology for further work-up and reassurance + obtain echo.  Check labs today: CBC, CMP, TSH, A1c.  CXR ordered.  Return in 6 weeks for further assessment, including spirometry.

## 2021-09-05 NOTE — Assessment & Plan Note (Signed)
Chronic, stable, continues Prilosec for GI protection with his inflammatory arthritis and Naproxen use.  Continue this and check Mag level today.

## 2021-09-05 NOTE — Assessment & Plan Note (Signed)
Chronic, ongoing with BP at goal in office today.  Recommend he monitor BP at least a few mornings a week at home and document.  DASH diet at home.  Continue current medication regimen and adjust as needed.  Labs today: CBC, CMP, TSH today.  Refills sent in.

## 2021-09-05 NOTE — Assessment & Plan Note (Signed)
Chronic, followed by Dr. Jefm Bryant with rheumatology, continue this collaboration and medications as ordered by them.

## 2021-09-05 NOTE — Patient Instructions (Signed)

## 2021-09-06 LAB — CBC WITH DIFFERENTIAL/PLATELET
Basophils Absolute: 0.1 10*3/uL (ref 0.0–0.2)
Basos: 1 %
EOS (ABSOLUTE): 0.2 10*3/uL (ref 0.0–0.4)
Eos: 2 %
Hematocrit: 44.2 % (ref 37.5–51.0)
Hemoglobin: 15 g/dL (ref 13.0–17.7)
Immature Grans (Abs): 0 10*3/uL (ref 0.0–0.1)
Immature Granulocytes: 0 %
Lymphocytes Absolute: 3.1 10*3/uL (ref 0.7–3.1)
Lymphs: 43 %
MCH: 31.7 pg (ref 26.6–33.0)
MCHC: 33.9 g/dL (ref 31.5–35.7)
MCV: 93 fL (ref 79–97)
Monocytes Absolute: 0.7 10*3/uL (ref 0.1–0.9)
Monocytes: 9 %
Neutrophils Absolute: 3.3 10*3/uL (ref 1.4–7.0)
Neutrophils: 45 %
Platelets: 297 10*3/uL (ref 150–450)
RBC: 4.73 x10E6/uL (ref 4.14–5.80)
RDW: 12.5 % (ref 11.6–15.4)
WBC: 7.3 10*3/uL (ref 3.4–10.8)

## 2021-09-06 LAB — COMPREHENSIVE METABOLIC PANEL
ALT: 23 IU/L (ref 0–44)
AST: 23 IU/L (ref 0–40)
Albumin/Globulin Ratio: 2.2 (ref 1.2–2.2)
Albumin: 4.3 g/dL (ref 3.8–4.8)
Alkaline Phosphatase: 76 IU/L (ref 44–121)
BUN/Creatinine Ratio: 12 (ref 10–24)
BUN: 12 mg/dL (ref 8–27)
Bilirubin Total: 0.5 mg/dL (ref 0.0–1.2)
CO2: 26 mmol/L (ref 20–29)
Calcium: 9.5 mg/dL (ref 8.6–10.2)
Chloride: 102 mmol/L (ref 96–106)
Creatinine, Ser: 1.03 mg/dL (ref 0.76–1.27)
Globulin, Total: 2 g/dL (ref 1.5–4.5)
Glucose: 91 mg/dL (ref 70–99)
Potassium: 4.6 mmol/L (ref 3.5–5.2)
Sodium: 140 mmol/L (ref 134–144)
Total Protein: 6.3 g/dL (ref 6.0–8.5)
eGFR: 83 mL/min/{1.73_m2} (ref >59)

## 2021-09-06 LAB — LIPID PANEL W/O CHOL/HDL RATIO
Cholesterol, Total: 159 mg/dL (ref 100–199)
HDL: 43 mg/dL (ref >39)
LDL Chol Calc (NIH): 100 mg/dL — ABNORMAL HIGH (ref 0–99)
Triglycerides: 84 mg/dL (ref 0–149)
VLDL Cholesterol Cal: 16 mg/dL (ref 5–40)

## 2021-09-06 LAB — PSA: Prostate Specific Ag, Serum: 1.5 ng/mL (ref 0.0–4.0)

## 2021-09-06 LAB — HIV ANTIBODY (ROUTINE TESTING W REFLEX): HIV Screen 4th Generation wRfx: NONREACTIVE

## 2021-09-06 LAB — HEMOGLOBIN A1C
Est. average glucose Bld gHb Est-mCnc: 120 mg/dL
Hgb A1c MFr Bld: 5.8 % — ABNORMAL HIGH (ref 4.8–5.6)

## 2021-09-06 LAB — TSH: TSH: 1.3 u[IU]/mL (ref 0.450–4.500)

## 2021-09-06 LAB — MAGNESIUM: Magnesium: 2 mg/dL (ref 1.6–2.3)

## 2021-09-06 NOTE — Progress Notes (Signed)
Good morning, please let Hunter Huffman know his labs have returned, he prefers call: - CBC shows no infection or anemia - Kidney and liver function are normal. - Thyroid level is normal.  Prostate lab is normal. - A1c shows some prediabetes -- highly recommend focus on healthy diet changes and regular exercise to avoid this level increasing and entering diabetic range. - Cholesterol levels show elevation in LDL, bad cholesterol.  I do highly recommend we start Rosuvastatin at low dose and see how you tolerate it.  Can start by taking daily and if any aches with this, then change to taking 3 days a week.  Are you okay with trying this, if so I will send in.  Any questions? Keep being awesome!!  Thank you for allowing me to participate in your care.  I appreciate you. Kindest regards, Corbin Falck

## 2021-09-17 ENCOUNTER — Ambulatory Visit: Payer: Medicare Other | Admitting: Cardiology

## 2021-09-18 ENCOUNTER — Ambulatory Visit (INDEPENDENT_AMBULATORY_CARE_PROVIDER_SITE_OTHER): Payer: Medicare Other | Admitting: *Deleted

## 2021-09-18 DIAGNOSIS — Z0000 Encounter for general adult medical examination without abnormal findings: Secondary | ICD-10-CM | POA: Diagnosis not present

## 2021-09-18 NOTE — Patient Instructions (Addendum)
Mr. Hunter Huffman , Thank you for taking time to come for your Medicare Wellness Visit. I appreciate your ongoing commitment to your health goals. Please review the following plan we discussed and let me know if I can assist you in the future.   Screening recommendations/referrals: Colonoscopy: Education provided Recommended yearly ophthalmology/optometry visit for glaucoma screening and checkup Recommended yearly dental visit for hygiene and checkup  Vaccinations: Influenza vaccine: up to date Pneumococcal vaccine: up to date Tdap vaccine: up to date Shingles vaccine: Education provided    Advanced directives: Education provided  Conditions/risks identified:    Preventive Care, Male Preventive care refers to lifestyle choices and visits with your health care provider that can promote health and wellness. What does preventive care include? A yearly physical exam. This is also called an annual well check. Dental exams once or twice a year. Routine eye exams. Ask your health care provider how often you should have your eyes checked. Personal lifestyle choices, including: Daily care of your teeth and gums. Regular physical activity. Eating a healthy diet. Avoiding tobacco and drug use. Limiting alcohol use. Practicing safe sex. Taking low doses of aspirin every day. Taking vitamin and mineral supplements as recommended by your health care provider. What happens during an annual well check? The services and screenings done by your health care provider during your annual well check will depend on your age, overall health, lifestyle risk factors, and family history of disease. Counseling  Your health care provider may ask you questions about your: Alcohol use. Tobacco use. Drug use. Emotional well-being. Home and relationship well-being. Sexual activity. Eating habits. History of falls. Memory and ability to understand (cognition). Work and work Statistician. Screening  You may  have the following tests or measurements: Height, weight, and BMI. Blood pressure. Lipid and cholesterol levels. These may be checked every 5 years, or more frequently if you are over 43 years old. Skin check. Lung cancer screening. You may have this screening every year starting at age 37 if you have a 30-pack-year history of smoking and currently smoke or have quit within the past 15 years. Fecal occult blood test (FOBT) of the stool. You may have this test every year starting at age 92. Flexible sigmoidoscopy or colonoscopy. You may have a sigmoidoscopy every 5 years or a colonoscopy every 10 years starting at age 63. Prostate cancer screening. Recommendations will vary depending on your family history and other risks. Hepatitis C blood test. Hepatitis B blood test. Sexually transmitted disease (STD) testing. Diabetes screening. This is done by checking your blood sugar (glucose) after you have not eaten for a while (fasting). You may have this done every 1-3 years. Abdominal aortic aneurysm (AAA) screening. You may need this if you are a current or former smoker. Osteoporosis. You may be screened starting at age 70 if you are at high risk. Talk with your health care provider about your test results, treatment options, and if necessary, the need for more tests. Vaccines  Your health care provider may recommend certain vaccines, such as: Influenza vaccine. This is recommended every year. Tetanus, diphtheria, and acellular pertussis (Tdap, Td) vaccine. You may need a Td booster every 10 years. Zoster vaccine. You may need this after age 16. Pneumococcal 13-valent conjugate (PCV13) vaccine. One dose is recommended after age 36. Pneumococcal polysaccharide (PPSV23) vaccine. One dose is recommended after age 25. Talk to your health care provider about which screenings and vaccines you need and how often you need them. This information is  not intended to replace advice given to you by your  health care provider. Make sure you discuss any questions you have with your health care provider. Document Released: 09/15/2015 Document Revised: 05/08/2016 Document Reviewed: 06/20/2015 Elsevier Interactive Patient Education  2017 [employer] Prevention in the Home Falls can cause injuries. They can happen to people of all ages. There are many things you can do to make your home safe and to help prevent falls. What can I do on the outside of my home? Regularly fix the edges of walkways and driveways and fix any cracks. Remove anything that might make you trip as you walk through a door, such as a raised step or threshold. Trim any bushes or trees on the path to your home. Use bright outdoor lighting. Clear any walking paths of anything that might make someone trip, such as rocks or tools. Regularly check to see if handrails are loose or broken. Make sure that both sides of any steps have handrails. Any raised decks and porches should have guardrails on the edges. Have any leaves, snow, or ice cleared regularly. Use sand or salt on walking paths during winter. Clean up any spills in your garage right away. This includes oil or grease spills. What can I do in the bathroom? Use night lights. Install grab bars by the toilet and in the tub and shower. Do not use towel bars as grab bars. Use non-skid mats or decals in the tub or shower. If you need to sit down in the shower, use a plastic, non-slip stool. Keep the floor dry. Clean up any water that spills on the floor as soon as it happens. Remove soap buildup in the tub or shower regularly. Attach bath mats securely with double-sided non-slip rug tape. Do not have throw rugs and other things on the floor that can make you trip. What can I do in the bedroom? Use night lights. Make sure that you have a light by your bed that is easy to reach. Do not use any sheets or blankets that are too big for your bed. They should not hang down  onto the floor. Have a firm chair that has side arms. You can use this for support while you get dressed. Do not have throw rugs and other things on the floor that can make you trip. What can I do in the kitchen? Clean up any spills right away. Avoid walking on wet floors. Keep items that you use a lot in easy-to-reach places. If you need to reach something above you, use a strong step stool that has a grab bar. Keep electrical cords out of the way. Do not use floor polish or wax that makes floors slippery. If you must use wax, use non-skid floor wax. Do not have throw rugs and other things on the floor that can make you trip. What can I do with my stairs? Do not leave any items on the stairs. Make sure that there are handrails on both sides of the stairs and use them. Fix handrails that are broken or loose. Make sure that handrails are as long as the stairways. Check any carpeting to make sure that it is firmly attached to the stairs. Fix any carpet that is loose or worn. Avoid having throw rugs at the top or bottom of the stairs. If you do have throw rugs, attach them to the floor with carpet tape. Make sure that you have a light switch at the top of the  stairs and the bottom of the stairs. If you do not have them, ask someone to add them for you. What else can I do to help prevent falls? Wear shoes that: Do not have high heels. Have rubber bottoms. Are comfortable and fit you well. Are closed at the toe. Do not wear sandals. If you use a stepladder: Make sure that it is fully opened. Do not climb a closed stepladder. Make sure that both sides of the stepladder are locked into place. Ask someone to hold it for you, if possible. Clearly mark and make sure that you can see: Any grab bars or handrails. First and last steps. Where the edge of each step is. Use tools that help you move around (mobility aids) if they are needed. These include: Canes. Walkers. Scooters. Crutches. Turn  on the lights when you go into a dark area. Replace any light bulbs as soon as they burn out. Set up your furniture so you have a clear path. Avoid moving your furniture around. If any of your floors are uneven, fix them. If there are any pets around you, be aware of where they are. Review your medicines with your doctor. Some medicines can make you feel dizzy. This can increase your chance of falling. Ask your doctor what other things that you can do to help prevent falls. This information is not intended to replace advice given to you by your health care provider. Make sure you discuss any questions you have with your health care provider. Document Released: 06/15/2009 Document Revised: 01/25/2016 Document Reviewed: 09/23/2014 Elsevier Interactive Patient Education  2017 [employer].

## 2021-09-18 NOTE — Progress Notes (Signed)
Subjective:   Hunter Huffman is a 61 y.o. male who presents for Medicare Annual/Subsequent preventive examination.  I connected with  Captain Blucher Lau on 09/18/21 by a telephone enabled telemedicine application and verified that I am speaking with the correct person using two identifiers.   I discussed the limitations of evaluation and management by telemedicine. The patient expressed understanding and agreed to proceed.  Patient location: home  Provider location: Tele-Health not in office    Review of Systems     Cardiac Risk Factors include: advanced age (>57men, >81 women)     Objective:    Today's Vitals   There is no height or weight on file to calculate BMI.  Advanced Directives 09/18/2021  Does Patient Have a Medical Advance Directive? No  Would patient like information on creating a medical advance directive? No - Patient declined    Current Medications (verified) Outpatient Encounter Medications as of 09/18/2021  Medication Sig   ENBREL SURECLICK 50 MG/ML injection Inject 50 mg into the skin once a week.    hydrochlorothiazide (HYDRODIURIL) 25 MG tablet Take 0.5 tablets (12.5 mg total) by mouth daily.   naproxen (NAPROSYN) 500 MG tablet Take 500 mg by mouth 2 (two) times daily.   omeprazole (PRILOSEC) 20 MG capsule Take 20 mg by mouth daily.   fluticasone (FLONASE) 50 MCG/ACT nasal spray Place 2 sprays into both nostrils daily. (Patient not taking: Reported on 09/05/2021)   [START ON 11/14/2021] Zoster Vaccine Adjuvanted [facility]) injection Inject 0.5 mLs into the muscle once for 1 dose. Dose # 2 (Patient not taking: Reported on 09/18/2021)   No facility-administered encounter medications on file as of 09/18/2021.    Allergies (verified) Patient has no known allergies.   History: Past Medical History:  Diagnosis Date   Hypertension    Inflammatory arthritis    Past Surgical History:  Procedure Laterality Date   HEMORRHOID SURGERY     Family History   Problem Relation Age of Onset   Diabetes Mother    Diabetes Father    Hypertension Father    Arthritis Father    Diabetes Brother    Social History   Socioeconomic History   Marital status: Single    Spouse name: Not on file   Number of children: Not on file   Years of education: Not on file   Highest education level: Not on file  Occupational History   Not on file  Tobacco Use   Smoking status: Every Day    Packs/day: 0.50    Types: Cigarettes   Smokeless tobacco: Never  Vaping Use   Vaping Use: Never used  Substance and Sexual Activity   Alcohol use: No    Comment: very rarely   Drug use: No   Sexual activity: Yes  Other Topics Concern   Not on file  Social History Narrative   Not on file   Social Determinants of Health   Financial Resource Strain: Low Risk    Difficulty of Paying Living Expenses: Not hard at all  Food Insecurity: No Food Insecurity   Worried About Charity fundraiser in the Last Year: Never true   Ran Out of Food in the Last Year: Never true  Transportation Needs: No Transportation Needs   Lack of Transportation (Medical): No   Lack of Transportation (Non-Medical): No  Physical Activity: Inactive   Days of Exercise per Week: 0 days   Minutes of Exercise per Session: 0 min  Stress: No Stress Concern  Present   Feeling of Stress : Not at all  Social Connections: Moderately Isolated   Frequency of Communication with Friends and Family: More than three times a week   Frequency of Social Gatherings with Friends and Family: Twice a week   Attends Religious Services: Never   Marine scientist or Organizations: No   Attends Music therapist: Never   Marital Status: Living with partner    Tobacco Counseling Ready to quit: Not Answered Counseling given: Not Answered   Clinical Intake:  Pre-visit preparation completed: Yes  Pain : No/denies pain     Nutritional Risks: None Diabetes: No  How often do you need to have  someone help you when you read instructions, pamphlets, or other written materials from your doctor or pharmacy?: 1 - Never  Diabetic?  no  Interpreter Needed?: No  Information entered by :: Leroy Kennedy LPN   Activities of Daily Living In your present state of health, do you have any difficulty performing the following activities: 09/18/2021 09/05/2021  Hearing? N N  Vision? N N  Difficulty concentrating or making decisions? N N  Walking or climbing stairs? N N  Dressing or bathing? N N  Doing errands, shopping? N N  Preparing Food and eating ? N -  Using the Toilet? N -  In the past six months, have you accidently leaked urine? N -  Managing your Medications? N -  Managing your Finances? N -  Housekeeping or managing your Housekeeping? N -  Some recent data might be hidden    Patient Care Team: Venita Lick, NP as PCP - General (Nurse Practitioner) Emmaline Kluver., MD (Rheumatology)  Indicate any recent Medical Services you may have received from other than [facility] providers in the past year (date may be approximate).     Assessment:   This is a routine wellness examination for Hunter Huffman.  Hearing/Vision screen Hearing Screening - Comments:: No trouble hearing  Vision Screening - Comments:: Not up to date  Dietary issues and exercise activities discussed: Current Exercise Habits: The patient does not participate in regular exercise at present   Goals Addressed             This Visit's Progress    Patient Stated       No goals       Depression Screen PHQ 2/9 Scores 09/18/2021 09/05/2021 09/05/2021 10/05/2018 09/05/2017 09/03/2016  PHQ - 2 Score 0 0 0 0 0 0  PHQ- 9 Score 0 2 - 2 1 -    Fall Risk Fall Risk  09/18/2021 09/05/2021 10/05/2018 07/10/2018 09/05/2017  Falls in the past year? 0 0 0 0 No  Number falls in past yr: 0 0 - - -  Injury with Fall? 0 0 - - -  Risk for fall due to : - No Fall Risks - - -  Follow up Falls evaluation completed;Falls prevention  discussed Falls evaluation completed Falls evaluation completed Falls evaluation completed -    FALL RISK PREVENTION PERTAINING TO THE HOME:  Any stairs in or around the home? Yes  If so, are there any without handrails? No  Home free of loose throw rugs in walkways, pet beds, electrical cords, etc? Yes  Adequate lighting in your home to reduce risk of falls? Yes   ASSISTIVE DEVICES UTILIZED TO PREVENT FALLS:  Life alert? No  Use of a cane, walker or w/c? No  Grab bars in the bathroom? No  Shower chair or  bench in shower? Yes  Elevated toilet seat or a handicapped toilet? Yes   TIMED UP AND GO:  Was the test performed? No .    Cognitive Function:  Normal cognitive status assessed by direct observation by this Nurse Health Advisor. No abnormalities found.          Immunizations Immunization History  Administered Date(s) Administered   Influenza,inj,Quad PF,6+ Mos 07/22/2017, 06/29/2018   Influenza-Unspecified 07/01/2016, 06/21/2020, 08/05/2021   PFIZER Comirnaty(Gray Top)Covid-19 Tri-Sucrose Vaccine 10/07/2020   Pneumococcal Polysaccharide-23 09/05/2021   Td 06/20/2008, 10/05/2018    TDAP status: Up to date  Flu Vaccine status: Up to date  Pneumococcal vaccine status: Up to date  Covid-19 vaccine status: Information provided on how to obtain vaccines.   Qualifies for Shingles Vaccine? Yes   Zostavax completed No   Shingrix Completed?: Yes  Screening Tests Health Maintenance  Topic Date Due   Zoster Vaccines- Shingrix (1 of 2) Never done   COVID-19 Vaccine (2 - Pfizer risk series) 09/21/2021 (Originally 10/28/2020)   COLONOSCOPY (Pts 45-43yrs Insurance coverage will need to be confirmed)  09/05/2022 (Originally 05/14/2005)   Pneumococcal Vaccine 46-29 Years old (2 - PCV) 09/05/2022   TETANUS/TDAP  10/05/2028   INFLUENZA VACCINE  Completed   Hepatitis C Screening  Completed   HIV Screening  Completed   HPV VACCINES  Aged Out    Health Maintenance  Health  Maintenance Due  Topic Date Due   Zoster Vaccines- Shingrix (1 of 2) Never done    Colonoscopy  declined  Lung Cancer Screening: (Low Dose CT Chest recommended if Age 51-80 years, 30 pack-year currently smoking OR have quit w/in 15years.) does not qualify.   Lung Cancer Screening Referral:   Additional Screening:  Hepatitis C Screening: does not qualify; Completed 2020  Vision Screening: Recommended annual ophthalmology exams for early detection of glaucoma and other disorders of the eye. Is the patient up to date with their annual eye exam?  No  Who is the provider or what is the name of the office in which the patient attends annual eye exams?  If pt is not established with a provider, would they like to be referred to a provider to establish care? No .   Dental Screening: Recommended annual dental exams for proper oral hygiene  Community Resource Referral / Chronic Care Management: CRR required this visit?  No   CCM required this visit?  No      Plan:     I have personally reviewed and noted the following in the patients chart:   Medical and social history Use of alcohol, tobacco or illicit drugs  Current medications and supplements including opioid prescriptions. Patient is not currently taking opioid prescriptions. Functional ability and status Nutritional status Physical activity Advanced directives List of other physicians Hospitalizations, surgeries, and ER visits in previous 12 months Vitals Screenings to include cognitive, depression, and falls Referrals and appointments  In addition, I have reviewed and discussed with patient certain preventive protocols, quality metrics, and best practice recommendations. A written personalized care plan for preventive services as well as general preventive health recommendations were provided to patient.     Leroy Kennedy, LPN   [id number]/[id number]   Nurse Notes:

## 2021-10-17 ENCOUNTER — Other Ambulatory Visit: Payer: Self-pay

## 2021-10-17 ENCOUNTER — Encounter: Payer: Self-pay | Admitting: Nurse Practitioner

## 2021-10-17 ENCOUNTER — Ambulatory Visit (INDEPENDENT_AMBULATORY_CARE_PROVIDER_SITE_OTHER): Payer: Medicare Other | Admitting: Nurse Practitioner

## 2021-10-17 VITALS — BP 132/84 | HR 68 | Temp 97.8°F | Ht 70.0 in | Wt 214.2 lb

## 2021-10-17 DIAGNOSIS — F17210 Nicotine dependence, cigarettes, uncomplicated: Secondary | ICD-10-CM | POA: Diagnosis not present

## 2021-10-17 DIAGNOSIS — J432 Centrilobular emphysema: Secondary | ICD-10-CM

## 2021-10-17 MED ORDER — TIOTROPIUM BROMIDE-OLODATEROL 2.5-2.5 MCG/ACT IN AERS: 2.0000 | INHALATION_SPRAY | Freq: Every day | RESPIRATORY_TRACT | 12 refills | Status: DC

## 2021-10-17 NOTE — Patient Instructions (Signed)

## 2021-10-17 NOTE — Assessment & Plan Note (Signed)
Chronic, ongoing with no current inhalers.  Spirometry today with FEV1 32% and FEV1/FVC 57% -- reviewed these results at length with him and educated him on emphysema.  Recommend complete cessation of smoking.  Will start Stiolto daily, educated him on this and importance of use due to progressive nature of disease.  He does not want a rescue inhaler at this time, reports he will never use.  Lung cancer screening referral in, will follow-up on this.  Return in 6 weeks.

## 2021-10-17 NOTE — Assessment & Plan Note (Signed)
I have recommended complete cessation of tobacco use. I have discussed various options available for assistance with tobacco cessation including over the counter methods (Nicotine gum, patch and lozenges). We also discussed prescription options (Chantix, Nicotine Inhaler / Nasal Spray). The patient is not interested in pursuing any prescription tobacco cessation options at this time.  Referral to lung CT CA screening program.

## 2021-10-17 NOTE — Progress Notes (Signed)
BP 132/84 (BP Location: Left Arm, Patient Position: Sitting, Cuff Size: Normal)    Pulse 68    Temp 97.8 F (36.6 C) (Oral)    Ht _0  (1.778 m)    Wt 214 lb 3.2 oz (97.2 kg)    SpO2 96%    BMI 30.73 kg/m    Subjective:    Patient ID: Hunter Huffman, male    DOB: [birth year], 61 y.o.   MRN: [id number]  HPI: LUISANGEL WAINRIGHT is a 62 y.o. male  Chief Complaint  Patient presents with   COPD   COPD Has had a chronic cough for several months, recent imaging did noted COPD changes without infiltrate.  Is a current every day smoker -- smoked 1/2 to 1 PPD, has smoked over 30 years.  Recently treated with antibiotic and Prednisone == overall cough better at this time.  Was referred for lung screening last visit, but has not heard from them.  Also had a cardiology referral for abnormal EKG, but has not attended yet -- he reports it was cancelled and he has not rescheduled. COPD status: uncontrolled Satisfied with current treatment?: yes Oxygen use: no Dyspnea frequency: with activity -- uphill to mailbox Cough frequency: occasional Rescue inhaler frequency:  none -- does not want one Limitation of activity: no Productive cough: none Last Spirometry: 10/17/21 Pneumovax: Up to Date Influenza: Up to Date   Relevant past medical, surgical, family and social history reviewed and updated as indicated. Interim medical history since our last visit reviewed. Allergies and medications reviewed and updated.  Review of Systems  Constitutional: Negative.   Respiratory:  Positive for cough and shortness of breath. Negative for chest tightness and wheezing.   Cardiovascular: Negative.   Gastrointestinal: Negative.   Skin: Negative.   Neurological: Negative.   Psychiatric/Behavioral: Negative.     Per HPI unless specifically indicated above     Objective:    BP 132/84 (BP Location: Left Arm, Patient Position: Sitting, Cuff Size: Normal)    Pulse 68    Temp 97.8 F (36.6 C) (Oral)    Ht 5'  10" (1.778 m)    Wt 214 lb 3.2 oz (97.2 kg)    SpO2 96%    BMI 30.73 kg/m   Wt Readings from Last 3 Encounters:  10/17/21 214 lb 3.2 oz (97.2 kg)  09/05/21 211 lb 12.8 oz (96.1 kg)  10/05/18 215 lb (97.5 kg)    Physical Exam Vitals and nursing note reviewed.  Constitutional:      General: He is awake. He is not in acute distress.    Appearance: He is well-developed and well-groomed. He is obese. He is not ill-appearing or toxic-appearing.  HENT:     Head: Normocephalic and atraumatic.     Right Ear: Hearing normal. No drainage.     Left Ear: Hearing normal. No drainage.  Eyes:     General: Lids are normal.        Right eye: No discharge.        Left eye: No discharge.     Conjunctiva/sclera: Conjunctivae normal.     Pupils: Pupils are equal, round, and reactive to light.  Neck:     Thyroid: No thyromegaly.     Vascular: No carotid bruit.  Cardiovascular:     Rate and Rhythm: Normal rate and regular rhythm.     Heart sounds: Normal heart sounds, S1 normal and S2 normal. No murmur heard.   No gallop.  Pulmonary:  Effort: Pulmonary effort is normal. No accessory muscle usage or respiratory distress.     Breath sounds: Normal breath sounds.  Abdominal:     General: Bowel sounds are normal.     Palpations: Abdomen is soft. There is no hepatomegaly or splenomegaly.  Musculoskeletal:        General: Normal range of motion.     Cervical back: Normal range of motion and neck supple.     Right lower leg: No edema.     Left lower leg: No edema.  Lymphadenopathy:     Cervical: No cervical adenopathy.  Skin:    General: Skin is warm and dry.     Capillary Refill: Capillary refill takes less than 2 seconds.     Findings: No rash.  Neurological:     Mental Status: He is alert and oriented to person, place, and time.     Deep Tendon Reflexes: Reflexes are normal and symmetric.  Psychiatric:        Mood and Affect: Mood normal.        Speech: Speech normal.        Behavior:  Behavior normal. Behavior is cooperative.        Thought Content: Thought content normal.    Results for orders placed or performed in visit on 09/05/21  CBC with Differential/Platelet  Result Value Ref Range   WBC 7.3 3.4 - 10.8 x10E3/uL   RBC 4.73 4.14 - 5.80 x10E6/uL   Hemoglobin 15.0 13.0 - 17.7 g/dL   Hematocrit 44.2 37.5 - 51.0 %   MCV 93 79 - 97 fL   MCH 31.7 26.6 - 33.0 pg   MCHC 33.9 31.5 - 35.7 g/dL   RDW 12.5 11.6 - 15.4 %   Platelets 297 150 - 450 x10E3/uL   Neutrophils 45 Not Estab. %   Lymphs 43 Not Estab. %   Monocytes 9 Not Estab. %   Eos 2 Not Estab. %   Basos 1 Not Estab. %   Neutrophils Absolute 3.3 1.4 - 7.0 x10E3/uL   Lymphocytes Absolute 3.1 0.7 - 3.1 x10E3/uL   Monocytes Absolute 0.7 0.1 - 0.9 x10E3/uL   EOS (ABSOLUTE) 0.2 0.0 - 0.4 x10E3/uL   Basophils Absolute 0.1 0.0 - 0.2 x10E3/uL   Immature Granulocytes 0 Not Estab. %   Immature Grans (Abs) 0.0 0.0 - 0.1 x10E3/uL  Comprehensive metabolic panel  Result Value Ref Range   Glucose 91 70 - 99 mg/dL   BUN 12 8 - 27 mg/dL   Creatinine, Ser 1.03 0.76 - 1.27 mg/dL   eGFR 83 >59 mL/min/1.73   BUN/Creatinine Ratio 12 10 - 24   Sodium 140 134 - 144 mmol/L   Potassium 4.6 3.5 - 5.2 mmol/L   Chloride 102 96 - 106 mmol/L   CO2 26 20 - 29 mmol/L   Calcium 9.5 8.6 - 10.2 mg/dL   Total Protein 6.3 6.0 - 8.5 g/dL   Albumin 4.3 3.8 - 4.8 g/dL   Globulin, Total 2.0 1.5 - 4.5 g/dL   Albumin/Globulin Ratio 2.2 1.2 - 2.2   Bilirubin Total 0.5 0.0 - 1.2 mg/dL   Alkaline Phosphatase 76 44 - 121 IU/L   AST 23 0 - 40 IU/L   ALT 23 0 - 44 IU/L  Lipid Panel w/o Chol/HDL Ratio  Result Value Ref Range   Cholesterol, Total 159 100 - 199 mg/dL   Triglycerides 84 0 - 149 mg/dL   HDL 43 >39 mg/dL   VLDL Cholesterol Cal 16  5 - 40 mg/dL   LDL Chol Calc (NIH) 100 (H) 0 - 99 mg/dL  TSH  Result Value Ref Range   TSH 1.300 0.450 - 4.500 uIU/mL  PSA  Result Value Ref Range   Prostate Specific Ag, Serum 1.5 0.0 - 4.0 ng/mL   HIV Antibody (routine testing w rflx)  Result Value Ref Range   HIV Screen 4th Generation wRfx Non Reactive Non Reactive  Magnesium  Result Value Ref Range   Magnesium 2.0 1.6 - 2.3 mg/dL  HgB A1c  Result Value Ref Range   Hgb A1c MFr Bld 5.8 (H) 4.8 - 5.6 %   Est. average glucose Bld gHb Est-mCnc 120 mg/dL      Assessment & Plan:   Problem List Items Addressed This Visit       Respiratory   Emphysema lung (HCC) - Primary    Chronic, ongoing with no current inhalers.  Spirometry today with FEV1 32% and FEV1/FVC 57% -- reviewed these results at length with him and educated him on emphysema.  Recommend complete cessation of smoking.  Will start Stiolto daily, educated him on this and importance of use due to progressive nature of disease.  He does not want a rescue inhaler at this time, reports he will never use.  Lung cancer screening referral in, will follow-up on this.  Return in 6 weeks.      Relevant Medications   Tiotropium Bromide-Olodaterol 2.5-2.5 MCG/ACT AERS   Other Relevant Orders   Spirometry with Graph (Completed)     Other   Nicotine dependence, cigarettes, uncomplicated    I have recommended complete cessation of tobacco use. I have discussed various options available for assistance with tobacco cessation including over the counter methods (Nicotine gum, patch and lozenges). We also discussed prescription options (Chantix, Nicotine Inhaler / Nasal Spray). The patient is not interested in pursuing any prescription tobacco cessation options at this time.  Referral to lung CT CA screening program.       Relevant Orders   Spirometry with Graph (Completed)     Follow up plan: Return in about 6 weeks (around 11/28/2021) for COPD.

## 2021-10-25 ENCOUNTER — Other Ambulatory Visit: Payer: Self-pay | Admitting: *Deleted

## 2021-10-25 DIAGNOSIS — F17210 Nicotine dependence, cigarettes, uncomplicated: Secondary | ICD-10-CM

## 2021-10-25 DIAGNOSIS — Z87891 Personal history of nicotine dependence: Secondary | ICD-10-CM

## 2021-11-07 ENCOUNTER — Encounter: Payer: Medicare Other | Admitting: Acute Care

## 2021-11-08 ENCOUNTER — Ambulatory Visit: Payer: Medicare Other

## 2021-11-13 ENCOUNTER — Ambulatory Visit
Admission: RE | Admit: 2021-11-13 | Discharge: 2021-11-13 | Disposition: A | Discharge status: Home / Self Care | Payer: Medicare Other | Source: Ambulatory Visit | Attending: Acute Care | Admitting: Acute Care

## 2021-11-13 ENCOUNTER — Encounter: Payer: Self-pay | Admitting: Acute Care

## 2021-11-13 ENCOUNTER — Other Ambulatory Visit: Payer: Self-pay

## 2021-11-13 ENCOUNTER — Ambulatory Visit (INDEPENDENT_AMBULATORY_CARE_PROVIDER_SITE_OTHER): Payer: Medicare Other | Admitting: Acute Care

## 2021-11-13 DIAGNOSIS — F17210 Nicotine dependence, cigarettes, uncomplicated: Secondary | ICD-10-CM

## 2021-11-13 DIAGNOSIS — Z87891 Personal history of nicotine dependence: Secondary | ICD-10-CM | POA: Diagnosis not present

## 2021-11-13 NOTE — Progress Notes (Signed)
Virtual Visit via Telephone Note ? ?I connected with Hunter Huffman on 11/13/21 at  9:00 AM EDT by telephone and verified that I am speaking with the correct person using two identifiers. ? ?Location: ?Patient:  At home ?Provider:  [place], [place], [place], [address]  ?  ?I discussed the limitations, risks, security and privacy concerns of performing an evaluation and management service by telephone and the availability of in person appointments. I also discussed with the patient that there may be a patient responsible charge related to this service. The patient expressed understanding and agreed to proceed. ? ? ?Shared Decision Making Visit Lung Cancer Screening Program ?([phone]) ? ? ?Eligibility: ?Age 61 y.o. ?Pack Years Smoking History Calculation 41 pack year smoking history ?(# packs/per year x # years smoked) ?Recent History of coughing up blood  no ?Unexplained weight loss? no ?( >Than 15 pounds within the last 6 months ) ?Prior History Lung / other cancer no ?(Diagnosis within the last 5 years already requiring surveillance chest CT Scans). ?Smoking Status Current Smoker ?Former Smokers: Years since quit:  NA ? Quit Date:  NA ? ?Visit Components: ?Discussion included one or more decision making aids. yes ?Discussion included risk/benefits of screening. yes ?Discussion included potential follow up diagnostic testing for abnormal scans. yes ?Discussion included meaning and risk of over diagnosis. yes ?Discussion included meaning and risk of False Positives. yes ?Discussion included meaning of total radiation exposure. yes ? ?Counseling Included: ?Importance of adherence to annual lung cancer LDCT screening. yes ?Impact of comorbidities on ability to participate in the program. yes ?Ability and willingness to under diagnostic treatment. yes ? ?Smoking Cessation Counseling: ?Current Smokers:  ?Discussed importance of smoking cessation. yes ?Information about tobacco cessation classes and  interventions provided to patient. yes ?Patient provided with "ticket" for LDCT Scan. yes ?Symptomatic Patient. no ? Counseling NA ?Diagnosis Code: Tobacco Use Z72.0 ?Asymptomatic Patient yes ? Counseling (Intermediate counseling: > three minutes counseling) D3220 ?Former Smokers:  ?Discussed the importance of maintaining cigarette abstinence. yes ?Diagnosis Code: Personal History of Nicotine Dependence. U54.270 ?Information about tobacco cessation classes and interventions provided to patient. Yes ?Patient provided with "ticket" for LDCT Scan. yes ?Written Order for Lung Cancer Screening with LDCT placed in Epic. Yes ?(CT Chest Lung Cancer Screening Low Dose W/O CM) [id number] ?Z12.2-Screening of respiratory organs ?Z87.891-Personal history of nicotine dependence ? ?I have spent 25 minutes of face to face/ virtual visit   time with  Hunter Huffman discussing the risks and benefits of lung cancer screening. We viewed / discussed a power point together that explained in detail the above noted topics. We paused at intervals to allow for questions to be asked and answered to ensure understanding.We discussed that the single most powerful action that he can take to decrease his risk of developing lung cancer is to quit smoking. We discussed whether or not he is ready to commit to setting a quit date. We discussed options for tools to aid in quitting smoking including nicotine replacement therapy, non-nicotine medications, support groups, Quit Smart classes, and behavior modification. We discussed that often times setting smaller, more achievable goals, such as eliminating 1 cigarette a day for a week and then 2 cigarettes a day for a week can be helpful in slowly decreasing the number of cigarettes smoked. This allows for a sense of accomplishment as well as providing a clinical benefit. I provided  him  with smoking cessation  information  with contact information for community resources,  classes, free nicotine replacement  therapy, and access to mobile apps, text messaging, and on-line smoking cessation help. I have also provided  him  the office contact information in the event he needs to contact me, or the screening staff. We discussed the time and location of the scan, and that either Doroteo Glassman RN, Joella Prince, RN  or I will call / send a letter with the results within 24-72 hours of receiving them. The patient verbalized understanding of all of  the above and had no further questions upon leaving the office. They have my contact information in the event they have any further questions. ? ?I spent 3 minutes counseling on smoking cessation and the health risks of continued tobacco abuse. ? ?I explained to the patient that there has been a high incidence of coronary artery disease noted on these exams. I explained that this is a non-gated exam therefore degree or severity cannot be determined. This patient is not on statin therapy. I have asked the patient to follow-up with their PCP regarding any incidental finding of coronary artery disease and management with diet or medication as their PCP  feels is clinically indicated. The patient verbalized understanding of the above and had no further questions upon completion of the visit. ? ?  ? ? ?Magdalen Spatz, NP ?11/13/2021 ? ? ? ? ? ? ?

## 2021-11-13 NOTE — Patient Instructions (Signed)
Thank you for participating in the [facility]. °It was our pleasure to meet you today. °We will call you with the results of your scan within the next few days. °Your scan will be assigned a Lung RADS category score by the physicians reading the scans.  °This Lung RADS score determines follow up scanning.  °See below for description of categories, and follow up screening recommendations. °We will be in touch to schedule your follow up screening annually or based on recommendations of our providers. °We will fax a copy of your scan results to your Primary Care Physician, or the physician who referred you to the program, to ensure they have the results. °Please call the office if you have any questions or concerns regarding your scanning experience or results.  °Our office number is [phone]. °Please speak with Denise Phelps, RN. She is our Lung Cancer Screening RN. °If she is unavailable when you call, please have the office staff send her a message. She will return your call at her earliest convenience. °Remember, if your scan is normal, we will scan you annually as long as you continue to meet the criteria for the program. (Age 55-77, Current smoker or smoker who has quit within the last 15 years). °If you are a smoker, remember, quitting is the single most powerful action that you can take to decrease your risk of lung cancer and other pulmonary, breathing related problems. °We know quitting is hard, and we are here to help.  °Please let us know if there is anything we can do to help you meet your goal of quitting. °If you are a former smoker, congratulations. We are proud of you! Remain smoke free! °Remember you can refer friends or family members through the number above.  °We will screen them to make sure they meet criteria for the program. °Thank you for helping us take better care of you by participating in Lung Screening. ° °You can receive free nicotine replacement therapy  ( patches, gum or mints) by calling 1-800-QUIT NOW. Please call so we can get you on the path to becoming  a non-smoker. I know it is hard, but you can do this! ° °Lung RADS Categories: ° °Lung RADS 1: no nodules or definitely non-concerning nodules.  °Recommendation is for a repeat annual scan in 12 months. ° °Lung RADS 2:  nodules that are non-concerning in appearance and behavior with a very low likelihood of becoming an active cancer. °Recommendation is for a repeat annual scan in 12 months. ° °Lung RADS 3: nodules that are probably non-concerning , includes nodules with a low likelihood of becoming an active cancer.  Recommendation is for a 6-month repeat screening scan. Often noted after an upper respiratory illness. We will be in touch to make sure you have no questions, and to schedule your 6-month scan. ° °Lung RADS 4 A: nodules with concerning findings, recommendation is most often for a follow up scan in 3 months or additional testing based on our provider's assessment of the scan. We will be in touch to make sure you have no questions and to schedule the recommended 3 month follow up scan. ° °Lung RADS 4 B:  indicates findings that are concerning. We will be in touch with you to schedule additional diagnostic testing based on our provider's  assessment of the scan. ° °Hypnosis for smoking cessation  °[employer] °[phone] ° °Acupuncture for smoking cessation  °[facility] °[phone]  °

## 2021-11-15 ENCOUNTER — Other Ambulatory Visit: Payer: Self-pay

## 2021-11-15 ENCOUNTER — Encounter: Payer: Self-pay | Admitting: Nurse Practitioner

## 2021-11-15 DIAGNOSIS — I700 Atherosclerosis of aorta: Secondary | ICD-10-CM | POA: Insufficient documentation

## 2021-11-25 NOTE — Patient Instructions (Incomplete)

## 2021-11-28 ENCOUNTER — Ambulatory Visit: Payer: Medicare Other | Admitting: Nurse Practitioner

## 2021-12-17 DIAGNOSIS — M47819 Spondylosis without myelopathy or radiculopathy, site unspecified: Secondary | ICD-10-CM | POA: Diagnosis not present

## 2021-12-17 DIAGNOSIS — Z79899 Other long term (current) drug therapy: Secondary | ICD-10-CM | POA: Diagnosis not present

## 2021-12-25 ENCOUNTER — Emergency Department
Admission: EM | Admit: 2021-12-25 | Discharge: 2021-12-25 | Disposition: A | Discharge status: Home / Self Care | Payer: Medicare Other | Attending: Emergency Medicine | Admitting: Emergency Medicine

## 2021-12-25 ENCOUNTER — Emergency Department: Payer: Medicare Other

## 2021-12-25 ENCOUNTER — Other Ambulatory Visit: Payer: Self-pay

## 2021-12-25 DIAGNOSIS — Z20822 Contact with and (suspected) exposure to covid-19: Secondary | ICD-10-CM | POA: Insufficient documentation

## 2021-12-25 DIAGNOSIS — R0602 Shortness of breath: Secondary | ICD-10-CM | POA: Diagnosis not present

## 2021-12-25 DIAGNOSIS — J188 Other pneumonia, unspecified organism: Secondary | ICD-10-CM | POA: Diagnosis not present

## 2021-12-25 DIAGNOSIS — R0781 Pleurodynia: Secondary | ICD-10-CM | POA: Diagnosis not present

## 2021-12-25 DIAGNOSIS — M546 Pain in thoracic spine: Secondary | ICD-10-CM | POA: Diagnosis not present

## 2021-12-25 DIAGNOSIS — M545 Low back pain, unspecified: Secondary | ICD-10-CM | POA: Insufficient documentation

## 2021-12-25 DIAGNOSIS — J189 Pneumonia, unspecified organism: Secondary | ICD-10-CM

## 2021-12-25 DIAGNOSIS — R109 Unspecified abdominal pain: Secondary | ICD-10-CM | POA: Diagnosis not present

## 2021-12-25 DIAGNOSIS — R0789 Other chest pain: Secondary | ICD-10-CM | POA: Diagnosis not present

## 2021-12-25 DIAGNOSIS — R059 Cough, unspecified: Secondary | ICD-10-CM | POA: Diagnosis present

## 2021-12-25 DIAGNOSIS — R079 Chest pain, unspecified: Secondary | ICD-10-CM | POA: Diagnosis not present

## 2021-12-25 LAB — BASIC METABOLIC PANEL
Anion gap: 6 (ref 5–15)
BUN: 10 mg/dL (ref 8–23)
CO2: 29 mmol/L (ref 22–32)
Calcium: 9 mg/dL (ref 8.9–10.3)
Chloride: 101 mmol/L (ref 98–111)
Creatinine, Ser: 1.28 mg/dL — ABNORMAL HIGH (ref 0.61–1.24)
GFR, Estimated: >60 mL/min (ref >60)
Glucose, Bld: 110 mg/dL — ABNORMAL HIGH (ref 70–99)
Potassium: 4.1 mmol/L (ref 3.5–5.1)
Sodium: 136 mmol/L (ref 135–145)

## 2021-12-25 LAB — URINALYSIS, ROUTINE W REFLEX MICROSCOPIC
Bilirubin Urine: NEGATIVE
Glucose, UA: NEGATIVE mg/dL
Hgb urine dipstick: NEGATIVE
Ketones, ur: NEGATIVE mg/dL
Leukocytes,Ua: NEGATIVE
Nitrite: NEGATIVE
Protein, ur: NEGATIVE mg/dL
Specific Gravity, Urine: 1.016 (ref 1.005–1.030)
pH: 6 (ref 5.0–8.0)

## 2021-12-25 LAB — CBC WITH DIFFERENTIAL/PLATELET
Abs Immature Granulocytes: 0.02 10*3/uL (ref 0.00–0.07)
Basophils Absolute: 0 10*3/uL (ref 0.0–0.1)
Basophils Relative: 1 %
Eosinophils Absolute: 0 10*3/uL (ref 0.0–0.5)
Eosinophils Relative: 0 %
HCT: 48.4 % (ref 39.0–52.0)
Hemoglobin: 16.4 g/dL (ref 13.0–17.0)
Immature Granulocytes: 0 %
Lymphocytes Relative: 30 %
Lymphs Abs: 1.8 10*3/uL (ref 0.7–4.0)
MCH: 32.3 pg (ref 26.0–34.0)
MCHC: 33.9 g/dL (ref 30.0–36.0)
MCV: 95.3 fL (ref 80.0–100.0)
Monocytes Absolute: 0.6 10*3/uL (ref 0.1–1.0)
Monocytes Relative: 10 %
Neutro Abs: 3.5 10*3/uL (ref 1.7–7.7)
Neutrophils Relative %: 59 %
Platelets: 195 10*3/uL (ref 150–400)
RBC: 5.08 MIL/uL (ref 4.22–5.81)
RDW: 12.9 % (ref 11.5–15.5)
WBC: 5.9 10*3/uL (ref 4.0–10.5)
nRBC: 0 % (ref 0.0–0.2)

## 2021-12-25 LAB — RESP PANEL BY RT-PCR (FLU A&B, COVID) ARPGX2
Influenza A by PCR: NEGATIVE
Influenza B by PCR: NEGATIVE
SARS Coronavirus 2 by RT PCR: NEGATIVE

## 2021-12-25 LAB — ACETAMINOPHEN LEVEL: Acetaminophen (Tylenol), Serum: 11 ug/mL (ref 10–30)

## 2021-12-25 LAB — TROPONIN I (HIGH SENSITIVITY)
Troponin I (High Sensitivity): 5 ng/L (ref <18)
Troponin I (High Sensitivity): 5 ng/L (ref <18)

## 2021-12-25 MED ORDER — IOHEXOL 350 MG/ML SOLN
100.0000 mL | Freq: Once | INTRAVENOUS | Status: AC | PRN
Start: 2021-12-25 — End: 2021-12-25
  Administered 2021-12-25: 100 mL via INTRAVENOUS

## 2021-12-25 MED ORDER — OXYCODONE-ACETAMINOPHEN 5-325 MG PO TABS
1.0000 | ORAL_TABLET | Freq: Once | ORAL | Status: AC
  Administered 2021-12-25: 1 via ORAL
  Filled 2021-12-25: qty 1

## 2021-12-25 MED ORDER — METHYLPREDNISOLONE SODIUM SUCC 125 MG IJ SOLR
125.0000 mg | Freq: Once | INTRAMUSCULAR | Status: AC
Start: 2021-12-25 — End: 2021-12-25
  Administered 2021-12-25: 125 mg via INTRAVENOUS
  Filled 2021-12-25: qty 2

## 2021-12-25 MED ORDER — IPRATROPIUM-ALBUTEROL 0.5-2.5 (3) MG/3ML IN SOLN
3.0000 mL | Freq: Once | RESPIRATORY_TRACT | Status: AC
  Administered 2021-12-25: 3 mL via RESPIRATORY_TRACT

## 2021-12-25 MED ORDER — ALBUTEROL SULFATE HFA 108 (90 BASE) MCG/ACT IN AERS: 2.0000 | INHALATION_SPRAY | RESPIRATORY_TRACT | 0 refills | Status: DC | PRN

## 2021-12-25 MED ORDER — PREDNISONE 20 MG PO TABS: 60.0000 mg | ORAL_TABLET | Freq: Every day | ORAL | 0 refills | Status: DC

## 2021-12-25 MED ORDER — SODIUM CHLORIDE 0.9 % IV SOLN
500.0000 mg | Freq: Once | INTRAVENOUS | Status: AC
  Administered 2021-12-25: 500 mg via INTRAVENOUS
  Filled 2021-12-25: qty 5

## 2021-12-25 MED ORDER — AZITHROMYCIN 250 MG PO TABS: ORAL_TABLET | ORAL | 0 refills | Status: DC

## 2021-12-25 MED ORDER — HYDROMORPHONE HCL 1 MG/ML IJ SOLN
1.0000 mg | Freq: Once | INTRAMUSCULAR | Status: AC
  Administered 2021-12-25: 1 mg via INTRAVENOUS
  Filled 2021-12-25: qty 1

## 2021-12-25 MED ORDER — OXYCODONE-ACETAMINOPHEN 5-325 MG PO TABS: 1.0000 | ORAL_TABLET | Freq: Four times a day (QID) | ORAL | 0 refills | Status: AC | PRN

## 2021-12-25 MED ORDER — IPRATROPIUM-ALBUTEROL 0.5-2.5 (3) MG/3ML IN SOLN
3.0000 mL | Freq: Once | RESPIRATORY_TRACT | Status: AC
  Administered 2021-12-25: 3 mL via RESPIRATORY_TRACT
  Filled 2021-12-25: qty 6

## 2021-12-25 NOTE — Discharge Instructions (Addendum)
Take the azithromycin and prednisone as prescribed starting tomorrow, and finish the full course of each. ? ?Take the Percocet as needed for pain.  You should not combine this with Tylenol.  Use the albuterol up to every 4 hours as needed for shortness of breath and wheezing. ? ?Follow-up with your primary care doctor within the next week. ? ?Return to the ER for new, worsening, or persistent severe pain, shortness of breath, fever, weakness or lightheadedness, or any other new or worsening symptoms that concern you. ? ? ?

## 2021-12-25 NOTE — ED Provider Notes (Signed)
? ?[facility] ?Provider Note ? ? ? Event Date/Time  ? First MD Initiated Contact with Patient 12/25/21 0805   ?  (approximate) ? ? ?History  ? ?painful ribs ? ? ?HPI ? ?Hunter Huffman is a 61 y.o. male with a history of emphysema who presents with left thoracic and back pain since yesterday, gradual onset, worsening course, and worsened with coughing or deep breaths.  The patient states that over the last few days he had nasal congestion and cough.  He now feels like he still needs to cough but is afraid to do so because of the pain.  He states he had a small pain in the same area last week and thought he may have pulled a muscle.  He denies any trauma or injury.  He denies any fever, vomiting, diarrhea, chest pain, abdominal pain, urinary symptoms, or leg swelling.  He also states that he took as many as 10 or 12 500 mg extra strength Tylenol tablets yesterday although states he was following the package instructions. ? ? ? ?Physical Exam  ? ?Triage Vital Signs: ?ED Triage Vitals  ?Enc Vitals Group  ?   BP 12/25/21 0802 (!) 151/82  ?   Pulse Rate 12/25/21 0802 85  ?   Resp 12/25/21 0802 20  ?   Temp 12/25/21 0802 97.7 ?F (36.5 ?C)  ?   Temp Source 12/25/21 0802 Oral  ?   SpO2 12/25/21 0802 97 %  ?   Weight --   ?   Height --   ?   Head Circumference --   ?   Peak Flow --   ?   Pain Score 12/25/21 0757 5  ?   Pain Loc --   ?   Pain Edu? --   ?   Excl. in [place]? --   ? ? ?Most recent vital signs: ?Vitals:  ? 12/25/21 1257 12/25/21 1315  ?BP: 134/79 129/89  ?Pulse: 77 78  ?Resp: 14 16  ?Temp: 97.7 ?F (36.5 ?C) 97.7 ?F (36.5 ?C)  ?SpO2: 90% 90%  ? ? ?General: Awake, uncomfortable appearing but in no distress. ?CV:  Good peripheral perfusion.  Normal heart sounds. ?Resp:  Normal effort.  Diffuse wheezing and prolonged expiratory phase.  Faint Rales anteriorly. ?Abd:  No distention.  ?Other:  No midline spinal tenderness.  Mild left lateral chest wall and mid low back tenderness with no step-off or  crepitus.  No significant edema.  2+ DP pulses to bilateral lower extremities. ? ? ?ED Results / Procedures / Treatments  ? ?Labs ?(all labs ordered are listed, but only abnormal results are displayed) ?Labs Reviewed  ?BASIC METABOLIC PANEL - Abnormal; Notable for the following components:  ?    Result Value  ? Glucose, Bld 110 (*)   ? Creatinine, Ser 1.28 (*)   ? All other components within normal limits  ?URINALYSIS, ROUTINE W REFLEX MICROSCOPIC - Abnormal; Notable for the following components:  ? Color, Urine STRAW (*)   ? APPearance CLEAR (*)   ? All other components within normal limits  ?RESP PANEL BY RT-PCR (FLU A&B, COVID) ARPGX2  ?CBC WITH DIFFERENTIAL/PLATELET  ?ACETAMINOPHEN LEVEL  ?TROPONIN I (HIGH SENSITIVITY)  ?TROPONIN I (HIGH SENSITIVITY)  ? ? ? ?EKG ? ? ?ED ECG REPORT ?IArta Silence, the attending physician, personally viewed and interpreted this ECG. ? ?Date: 12/25/2021 ?EKG Time: 0803 ?Rate: 83 ?Rhythm: normal sinus rhythm ?QRS Axis: normal ?Intervals: normal ?ST/T Wave abnormalities: normal (interpretation  limited by poor EKG baseline) ?Narrative Interpretation: no evidence of acute ischemia  ? ?RADIOLOGY ? ?Chest x-ray: I independently viewed and interpreted the images; there is no focal consolidation or edema ? ?CT angio chest/abdomen/pelvis: I independently viewed and interpreted the images.  There is no venous of aortic dissection.  Radiology report confirms no vascular findings or PE and notes bilateral lung nodularity consistent with atypical infection. ? ?PROCEDURES: ? ?Critical Care performed: No ? ?Procedures ? ? ?MEDICATIONS ORDERED IN ED: ?Medications  ?HYDROmorphone (DILAUDID) injection 1 mg (1 mg Intravenous Given 12/25/21 0830)  ?ipratropium-albuterol (DUONEB) 0.5-2.5 (3) MG/3ML nebulizer solution 3 mL (3 mLs Nebulization Given 12/25/21 0835)  ?methylPREDNISolone sodium succinate (SOLU-MEDROL) 125 mg/2 mL injection 125 mg (125 mg Intravenous Given 12/25/21 0832)   ?ipratropium-albuterol (DUONEB) 0.5-2.5 (3) MG/3ML nebulizer solution 3 mL (3 mLs Nebulization Given 12/25/21 0835)  ?iohexol (OMNIPAQUE) 350 MG/ML injection 100 mL (100 mLs Intravenous Contrast Given 12/25/21 1037)  ?azithromycin (ZITHROMAX) 500 mg in sodium chloride 0.9 % 250 mL IVPB (0 mg Intravenous Stopped 12/25/21 1327)  ?oxyCODONE-acetaminophen (PERCOCET/ROXICET) 5-325 MG per tablet 1 tablet (1 tablet Oral Given 12/25/21 1204)  ? ? ? ?IMPRESSION / MDM / ASSESSMENT AND PLAN / ED COURSE  ?I reviewed the triage vital signs and the nursing notes. ? ?61 year old male with PMH as noted above presents with left thoracic and flank pain since yesterday associated with cough and some URI symptoms. ? ?On exam the patient is uncomfortable but overall well-appearing.  His vital signs are normal.  O2 saturation is in the mid to high 90s on room air.  He does have diffuse wheezing bilaterally, some rales, and mild tenderness to the left lateral chest wall and mid back. ? ?Differential diagnosis includes, but is not limited to, COPD/emphysema exacerbation, acute bronchitis, pneumonia, COVID-19 or other viral etiology, rib fracture.  I have a low suspicion for ACS given the lack of chest pain and nonischemic EKG.  I also have a low suspicion for aortic dissection, PE, or other vascular cause given the reassuring vital signs, somewhat reproducible nature of the pain, and its location. ? ?We will obtain chest x-ray, lab work-up, give analgesia, bronchodilators, steroid, and reassess. ? ?The patient is on the cardiac monitor to evaluate for evidence of arrhythmia and/or significant heart rate changes. ? ?----------------------------------------- ?9:51 AM on 12/25/2021 ?----------------------------------------- ? ?Initial work-up is unrevealing.  Chest x-ray is negative for acute findings.  Troponin is negative.  Respiratory panel is negative.  CBC and BMP are unremarkable with no leukocytosis or other significant findings. ? ?We  will obtain a CT angio of the chest and abdomen to rule out vascular etiology such as aortic dissection or renal artery infarct and also evaluate for occult rib fracture or pulmonary findings not seen on the x-ray. ? ?----------------------------------------- ?1:17 PM on 12/25/2021 ?----------------------------------------- ? ?CT angio shows no vascular findings or fractures but does show evidence of possible atypical pneumonia.  This is overall consistent with the patient's presentation. ? ?On reassessment, the patient appears significantly more comfortable.  O2 saturation is around 90 to 92% on room air.  He occasionally dips to 88 to 89% briefly, however the patient states that his O2 saturation is usually around 90% when he has checked it.  He does not demonstrate any increased work of breathing or respiratory distress.  He is speaking full sentences and ambulating without difficulty. ? ?I did consider whether the patient may benefit from admission given the borderline O2 saturation and atypical pneumonia seen  on CT.  I offered the patient admission, however he states he prefers to go home if possible.  Given his stable vital signs, reassuring imaging and lab work-up, and overall well appearance, I think that this is reasonable. ? ?I have ordered a dose of azithromycin to be given in the ED and have prescribed an outpatient course of azithromycin as well as prednisone and albuterol for likely bronchospasm/COPD exacerbation, and Percocet for pain.  I had extensive discussion with the patient and his wife about the results of the work-up and plan of care.  I gave him very thorough return precautions and he expressed understanding. ? ? ?FINAL CLINICAL IMPRESSION(S) / ED DIAGNOSES  ? ?Final diagnoses:  ?Atypical pneumonia  ?Chest wall pain  ? ? ? ?Rx / DC Orders  ? ?ED Discharge Orders   ? ?      Ordered  ?  predniSONE (DELTASONE) 20 MG tablet  Daily with breakfast       ? 12/25/21 1315  ?  azithromycin (ZITHROMAX  Z-PAK) 250 MG tablet       ? 12/25/21 1315  ?  azithromycin (ZITHROMAX Z-PAK) 250 MG tablet  Status:  Discontinued       ? 12/25/21 1315  ?  albuterol (VENTOLIN HFA) 108 (90 Base) MCG/ACT inhaler  Every 4 hours PRN

## 2021-12-25 NOTE — ED Triage Notes (Signed)
Pt c/o flank/posterior left rib pain with SOB, states he has been sick with a cold over the weekend, pt has audible wheezing on arrival ?

## 2021-12-25 NOTE — ED Notes (Signed)
E-signature not working at this time. Pt verbalized understanding of D/C instructions, prescriptions and follow up care with no further questions at this time. Pt in NAD and ambulatory at time of D/C.  

## 2021-12-25 NOTE — ED Notes (Signed)
Pt to CT

## 2021-12-26 ENCOUNTER — Encounter: Payer: Self-pay | Admitting: Emergency Medicine

## 2021-12-26 ENCOUNTER — Observation Stay
Admission: EM | Admit: 2021-12-26 | Discharge: 2021-12-28 | Disposition: A | Discharge status: Home / Self Care | Payer: Medicare Other | Attending: Hospitalist | Admitting: Hospitalist

## 2021-12-26 ENCOUNTER — Emergency Department: Payer: Medicare Other

## 2021-12-26 DIAGNOSIS — J9601 Acute respiratory failure with hypoxia: Secondary | ICD-10-CM | POA: Diagnosis not present

## 2021-12-26 DIAGNOSIS — R109 Unspecified abdominal pain: Secondary | ICD-10-CM | POA: Diagnosis present

## 2021-12-26 DIAGNOSIS — J123 Human metapneumovirus pneumonia: Secondary | ICD-10-CM

## 2021-12-26 DIAGNOSIS — R0789 Other chest pain: Secondary | ICD-10-CM | POA: Insufficient documentation

## 2021-12-26 DIAGNOSIS — Z79899 Other long term (current) drug therapy: Secondary | ICD-10-CM | POA: Insufficient documentation

## 2021-12-26 DIAGNOSIS — R0602 Shortness of breath: Secondary | ICD-10-CM | POA: Diagnosis not present

## 2021-12-26 DIAGNOSIS — I10 Essential (primary) hypertension: Secondary | ICD-10-CM | POA: Diagnosis not present

## 2021-12-26 DIAGNOSIS — F17210 Nicotine dependence, cigarettes, uncomplicated: Secondary | ICD-10-CM | POA: Insufficient documentation

## 2021-12-26 DIAGNOSIS — J441 Chronic obstructive pulmonary disease with (acute) exacerbation: Secondary | ICD-10-CM | POA: Diagnosis not present

## 2021-12-26 DIAGNOSIS — R0781 Pleurodynia: Secondary | ICD-10-CM | POA: Diagnosis present

## 2021-12-26 DIAGNOSIS — R10A2 Flank pain, left side: Secondary | ICD-10-CM

## 2021-12-26 LAB — CBC WITH DIFFERENTIAL/PLATELET
Abs Immature Granulocytes: 0.05 10*3/uL (ref 0.00–0.07)
Basophils Absolute: 0 10*3/uL (ref 0.0–0.1)
Basophils Relative: 0 %
Eosinophils Absolute: 0 10*3/uL (ref 0.0–0.5)
Eosinophils Relative: 0 %
HCT: 47.7 % (ref 39.0–52.0)
Hemoglobin: 15.8 g/dL (ref 13.0–17.0)
Immature Granulocytes: 0 %
Lymphocytes Relative: 18 %
Lymphs Abs: 2.1 10*3/uL (ref 0.7–4.0)
MCH: 31.8 pg (ref 26.0–34.0)
MCHC: 33.1 g/dL (ref 30.0–36.0)
MCV: 96 fL (ref 80.0–100.0)
Monocytes Absolute: 1.1 10*3/uL — ABNORMAL HIGH (ref 0.1–1.0)
Monocytes Relative: 10 %
Neutro Abs: 8.2 10*3/uL — ABNORMAL HIGH (ref 1.7–7.7)
Neutrophils Relative %: 72 %
Platelets: 222 10*3/uL (ref 150–400)
RBC: 4.97 MIL/uL (ref 4.22–5.81)
RDW: 13.2 % (ref 11.5–15.5)
WBC: 11.5 10*3/uL — ABNORMAL HIGH (ref 4.0–10.5)
nRBC: 0 % (ref 0.0–0.2)

## 2021-12-26 LAB — TROPONIN I (HIGH SENSITIVITY): Troponin I (High Sensitivity): 6 ng/L (ref <18)

## 2021-12-26 LAB — RESPIRATORY PANEL BY PCR

## 2021-12-26 LAB — BASIC METABOLIC PANEL
Anion gap: 9 (ref 5–15)
BUN: 14 mg/dL (ref 8–23)
CO2: 26 mmol/L (ref 22–32)
Calcium: 9.5 mg/dL (ref 8.9–10.3)
Chloride: 103 mmol/L (ref 98–111)
Creatinine, Ser: 1.1 mg/dL (ref 0.61–1.24)
GFR, Estimated: >60 mL/min (ref >60)
Glucose, Bld: 137 mg/dL — ABNORMAL HIGH (ref 70–99)
Potassium: 4.1 mmol/L (ref 3.5–5.1)
Sodium: 138 mmol/L (ref 135–145)

## 2021-12-26 LAB — PROCALCITONIN: Procalcitonin: <0.1 ng/mL

## 2021-12-26 LAB — D-DIMER, QUANTITATIVE: D-Dimer, Quant: 0.72 ug/mL-FEU — ABNORMAL HIGH (ref 0.00–0.50)

## 2021-12-26 MED ORDER — IPRATROPIUM-ALBUTEROL 0.5-2.5 (3) MG/3ML IN SOLN
3.0000 mL | Freq: Once | RESPIRATORY_TRACT | Status: AC
  Administered 2021-12-26: 3 mL via RESPIRATORY_TRACT
  Filled 2021-12-26: qty 3

## 2021-12-26 MED ORDER — PREDNISONE 20 MG PO TABS
40.0000 mg | ORAL_TABLET | Freq: Every day | ORAL | Status: DC
  Administered 2021-12-27 – 2021-12-28 (×2): 40 mg via ORAL
  Filled 2021-12-26 (×2): qty 2

## 2021-12-26 MED ORDER — SODIUM CHLORIDE 0.9 % IV SOLN
1.0000 g | Freq: Once | INTRAVENOUS | Status: AC
  Administered 2021-12-26: 1 g via INTRAVENOUS
  Filled 2021-12-26: qty 10

## 2021-12-26 MED ORDER — PREDNISONE 20 MG PO TABS
40.0000 mg | ORAL_TABLET | Freq: Every day | ORAL | Status: DC
  Administered 2021-12-26: 40 mg via ORAL
  Filled 2021-12-26: qty 2

## 2021-12-26 MED ORDER — IPRATROPIUM-ALBUTEROL 0.5-2.5 (3) MG/3ML IN SOLN
3.0000 mL | Freq: Four times a day (QID) | RESPIRATORY_TRACT | Status: DC
  Administered 2021-12-26 – 2021-12-27 (×3): 3 mL via RESPIRATORY_TRACT
  Filled 2021-12-26 (×3): qty 3

## 2021-12-26 MED ORDER — SODIUM CHLORIDE 0.9 % IV SOLN
500.0000 mg | Freq: Once | INTRAVENOUS | Status: AC
  Administered 2021-12-26: 500 mg via INTRAVENOUS
  Filled 2021-12-26: qty 5

## 2021-12-26 MED ORDER — ONDANSETRON HCL 4 MG/2ML IJ SOLN: 4.0000 mg | Freq: Four times a day (QID) | INTRAMUSCULAR | Status: DC | PRN

## 2021-12-26 MED ORDER — IBUPROFEN 400 MG PO TABS
400.0000 mg | ORAL_TABLET | Freq: Four times a day (QID) | ORAL | Status: DC | PRN
  Administered 2021-12-27: 400 mg via ORAL
  Filled 2021-12-26: qty 1

## 2021-12-26 MED ORDER — HYDROMORPHONE HCL 1 MG/ML IJ SOLN
1.0000 mg | Freq: Once | INTRAMUSCULAR | Status: AC
  Administered 2021-12-26: 1 mg via INTRAVENOUS
  Filled 2021-12-26: qty 1

## 2021-12-26 MED ORDER — HYDRALAZINE HCL 20 MG/ML IJ SOLN: 10.0000 mg | Freq: Four times a day (QID) | INTRAMUSCULAR | Status: DC | PRN

## 2021-12-26 MED ORDER — POLYETHYLENE GLYCOL 3350 17 G PO PACK
34.0000 g | PACK | Freq: Two times a day (BID) | ORAL | Status: DC
  Administered 2021-12-26 – 2021-12-27 (×2): 34 g via ORAL
  Filled 2021-12-26 (×3): qty 2

## 2021-12-26 MED ORDER — ONDANSETRON 4 MG PO TBDP
4.0000 mg | ORAL_TABLET | Freq: Three times a day (TID) | ORAL | Status: DC | PRN
Start: 2021-12-26 — End: 2021-12-28
  Filled 2021-12-26: qty 1

## 2021-12-26 MED ORDER — ENOXAPARIN SODIUM 40 MG/0.4ML IJ SOSY
40.0000 mg | PREFILLED_SYRINGE | INTRAMUSCULAR | Status: DC
  Administered 2021-12-26 – 2021-12-27 (×2): 40 mg via SUBCUTANEOUS
  Filled 2021-12-26: qty 0.4

## 2021-12-26 MED ORDER — CYCLOBENZAPRINE HCL 10 MG PO TABS
5.0000 mg | ORAL_TABLET | Freq: Once | ORAL | Status: AC
  Administered 2021-12-26: 5 mg via ORAL
  Filled 2021-12-26: qty 1

## 2021-12-26 MED ORDER — SODIUM CHLORIDE 0.9 % IV BOLUS
500.0000 mL | Freq: Once | INTRAVENOUS | Status: AC
  Administered 2021-12-26: 500 mL via INTRAVENOUS

## 2021-12-26 MED ORDER — ACETAMINOPHEN 500 MG PO TABS
1000.0000 mg | ORAL_TABLET | Freq: Three times a day (TID) | ORAL | Status: DC | PRN
  Administered 2021-12-28: 1000 mg via ORAL
  Filled 2021-12-26: qty 2

## 2021-12-26 MED ORDER — PANTOPRAZOLE SODIUM 40 MG PO TBEC
40.0000 mg | DELAYED_RELEASE_TABLET | Freq: Every day | ORAL | Status: DC
  Administered 2021-12-27: 40 mg via ORAL
  Filled 2021-12-26: qty 1

## 2021-12-26 NOTE — ED Provider Notes (Signed)
? ?[facility] ?Provider Note ? ? ? Event Date/Time  ? First MD Initiated Contact with Patient 12/26/21 [phone]   ?  (approximate) ? ? ?History  ? ?Flank Pain ? ? ?HPI ? ?Hunter Huffman is a 61 y.o. male with a history of emphysema, hypertension, and GERD who presents with left flank, chest wall and upper back pain over the last several days, persistent course, and not relieved by Percocet at home.  The pain is severe in intensity especially when he coughs or tries to take a deep breath.  He has had a cough over the last several days as well as some increased shortness of breath.  He was seen in the ED yesterday and diagnosed with atypical pneumonia.  He ultimately decided to go home although states that his symptoms worsened during the night and the pain which was previously intermittent is now more constant. ? ? ?Physical Exam  ? ?Triage Vital Signs: ?ED Triage Vitals  ?Enc Vitals Group  ?   BP 12/26/21 0753 (!) 161/86  ?   Pulse Rate 12/26/21 0753 86  ?   Resp 12/26/21 0753 (!) 22  ?   Temp 12/26/21 0753 98 ?F (36.7 ?C)  ?   Temp src --   ?   SpO2 12/26/21 0753 91 %  ?   Weight 12/26/21 0747 214 lb 4.6 oz (97.2 kg)  ?   Height 12/26/21 0747 '5\' 10"'$  (1.778 m)  ?   Head Circumference --   ?   Peak Flow --   ?   Pain Score 12/26/21 0746 8  ?   Pain Loc --   ?   Pain Edu? --   ?   Excl. in [place]? --   ? ? ?Most recent vital signs: ?Vitals:  ? 12/26/21 0830 12/26/21 0900  ?BP: 140/81 121/69  ?Pulse: 82 64  ?Resp: 16 14  ?Temp:    ?SpO2: (!) 89% 92%  ? ? ? ?General: Alert, uncomfortable appearing but in no acute distress. ?CV:  Good peripheral perfusion.  Normal heart sounds. ?Resp:  Increased respiratory effort.  Faint wheezing bilaterally. ?Abd:  No distention.  ?Other:  Left lateral thorax and upper flank area with mild tenderness to palpation but no step-off or crepitus.  No point tenderness.  No midline spinal tenderness. ? ? ?ED Results / Procedures / Treatments  ? ?Labs ?(all labs ordered are  listed, but only abnormal results are displayed) ?Labs Reviewed  ?BASIC METABOLIC PANEL - Abnormal; Notable for the following components:  ?    Result Value  ? Glucose, Bld 137 (*)   ? All other components within normal limits  ?D-DIMER, QUANTITATIVE (NOT AT [facility]) - Abnormal; Notable for the following components:  ? D-Dimer, Quant 0.72 (*)   ? All other components within normal limits  ?CBC WITH DIFFERENTIAL/PLATELET  ?PROCALCITONIN  ?TROPONIN I (HIGH SENSITIVITY)  ? ? ? ?EKG ? ?ED ECG REPORT ?IArta Silence, the attending physician, personally viewed and interpreted this ECG. ? ?Date: 12/26/2021 ?EKG Time: 0756 ?Rate: 87 ?Rhythm: normal sinus rhythm ?QRS Axis: Right axis ?Intervals: normal ?ST/T Wave abnormalities: normal ?Narrative Interpretation: no evidence of acute ischemia; no significant change when compared to EKG from yesterday ? ? ? ?RADIOLOGY ? ?Chest x-ray: I independently viewed and interpreted the images; bilateral reticular nodule opacities somewhat worsened since x-ray from yesterday ? ?PROCEDURES: ? ?Critical Care performed: No ? ?Procedures ? ? ?MEDICATIONS ORDERED IN ED: ?Medications  ?cefTRIAXone (ROCEPHIN) 1  g in sodium chloride 0.9 % 100 mL IVPB (has no administration in time range)  ?azithromycin (ZITHROMAX) 500 mg in sodium chloride 0.9 % 250 mL IVPB (has no administration in time range)  ?ipratropium-albuterol (DUONEB) 0.5-2.5 (3) MG/3ML nebulizer solution 3 mL (has no administration in time range)  ?ibuprofen (ADVIL) tablet 400 mg (has no administration in time range)  ?HYDROmorphone (DILAUDID) injection 1 mg (1 mg Intravenous Given 12/26/21 0810)  ?sodium chloride 0.9 % bolus 500 mL (500 mLs Intravenous New Bag/Given 12/26/21 0810)  ?ipratropium-albuterol (DUONEB) 0.5-2.5 (3) MG/3ML nebulizer solution 3 mL (3 mLs Nebulization Given 12/26/21 0812)  ?ipratropium-albuterol (DUONEB) 0.5-2.5 (3) MG/3ML nebulizer solution 3 mL (3 mLs Nebulization Given 12/26/21 0812)  ? ? ? ?IMPRESSION / MDM  / ASSESSMENT AND PLAN / ED COURSE  ?I reviewed the triage vital signs and the nursing notes. ? ?61 year old male with PMH as noted above presents with persistent left-sided thoracic and flank pain over the last several days associated with cough and increased shortness of breath. ? ?I actually evaluated the patient in the ED yesterday with the same symptoms.  At that time he had lab work-up which was reassuring, chest x-ray which did not show any acute findings, but CT angio of the chest/abdomen/pelvis which showed bilateral nodular lung opacities consistent with atypical infection but no other acute findings.  He was ruled out for PE, aortic dissection or other vascular abnormality, ureteral stone and rib fracture. ? ?At that time the patient had borderline hypoxia and was offered admission but ultimately decided to go home.  He was discharged with albuterol, prednisone, azithromycin, and Percocet.  He states that the symptoms worsened during the night and the pain is now more persistent. ? ?Given the extensive and overall reassuring work-up yesterday, I suspect continuing symptoms due to atypical pneumonia likely with a component of muscle strain or spasm or possible rib contusion.  Given the negative imaging yesterday and the stable vital signs, there is no clinical evidence for PE, vascular etiology, or intra-abdominal or urinary tract etiology. ? ?We will obtain repeat labs, chest x-ray, give analgesia, and plan for likely admission. ? ?The patient is on the cardiac monitor to evaluate for evidence of arrhythmia and/or significant heart rate changes. ? ?----------------------------------------- ?9:40 AM on 12/26/2021 ?----------------------------------------- ? ?Chest x-ray shows slightly worsened nodularity compared to yesterday although comparable with the CT from yesterday.  Lab work-up so far is reassuring.  D-dimer is slightly elevated, however the patient was ruled out for PE on the study yesterday. ? ?He  is feeling much better after the Dilaudid and nebulizer treatments and has been able to cough up some phlegm, however his O2 saturation was in the high 80s and he is now on 2 L.  Given the persistent relatively severe pain and his new oxygen requirement we will proceed with admission.  I consulted Dr. Billie Ruddy from the hospitalist service; based on her discussion she agrees to admit the patient ? ? ?FINAL CLINICAL IMPRESSION(S) / ED DIAGNOSES  ? ?Final diagnoses:  ?Acute respiratory failure with hypoxia ([place])  ?Chest wall pain  ? ? ? ?Rx / DC Orders  ? ?ED Discharge Orders   ? ? None  ? ?  ? ? ? ?Note:  This document was prepared using Dragon voice recognition software and may include unintentional dictation errors.  ?  Arta Silence, MD ?12/26/21 [phone] ? ?

## 2021-12-26 NOTE — ED Triage Notes (Signed)
C/O left flank/posterior left rib pain with SOB.  C/O pain worse with inspiration and feel worsening SOB ?

## 2021-12-26 NOTE — ED Notes (Signed)
Pt presents to the ED for R side pain, near the patient's ribs. States that he was seen here and discharged yesterday. Pt was offered admission but refused. Pt dx with atypical PNA yesterday. From work up yesterday, PNA was the only finding. Pt c/o pain that got worse. Pt does have some inspiratory wheezing. Pt has a hx of COPD and still currently smokes. Pt states pt is worse when coughing and it hurts for him to take a deep breath. Pt is A&Ox4 and NAD.  ?

## 2021-12-26 NOTE — ED Notes (Signed)
Provider at bedside

## 2021-12-26 NOTE — H&P (Signed)
?History and Physical  ? ? ?Hunter Huffman PNT:[id number] DOB: Mar 25, 1961 DOA: 12/26/2021 ? ?PCP: Venita Lick, NP  ?Patient coming from: home ? ?I have personally briefly reviewed patient's old medical records in [place] ? ?Chief Complaint: dyspnea and left flank pain ? ?HPI: Hunter Huffman is a 61 y.o. male with medical history significant of HTN and smoking who presented for the 2nd time in 2 days for dyspnea and pain in his left flank.  ? ?Pt reported sudden development of pain in his left flank area with "a cough" on Monday night (2 days ago).  Pain felt deep and sharp and jabbing.  Pt specifically said he didn't have excessive cough leading to the pain.  Pt had a similar episode about 3 weeks ago, however, flank pain went away at that time, but this time, the flank pain persisted, and worsened with coughing, sneezing and position changes.  Pt also felt like he had a cold over the weekend and had dyspnea and cough.  Pt presented to the ED Tuesday and had largely unremarkable CTA c/a/p and was discharged home the same day.  Pt presented today for the same complaints.  No fever, chest pain, N/V/D.  Pt reported feeling constipated.  Pt has no formal dx of COPD, however, is a long-time and current smoker. ? ?ED Course: initial vitals: afebrile, pulse 86, BP 161/86, RR 22, sating 91% on room air.  Later, ED RN noted O2 sat dropped to 87% on room air, and pt was placed on 2L O2 with sats increased to 93%.  Labs largely unremarkable.  Procal neg.  Trop neg.  CTA c/a/p showed Emphysema and finding suggesting atypical infection, but no dissection, no kidney stones, normal kidneys, No acute osseous findings, and normal bowels.  Pt was started on ceftriaxone, azithromycin and DuoNeb in the ED before admission. ? ?Assessment/Plan ?Principal Problem: ?  COPD with acute exacerbation ([place]) ?Active Problems: ?  Rib pain ? ?# COPD exacerbation ?--hypoxia, increased dyspnea, increased cough.  Imaging with  emphysematous changes in a pt with extensive and current smoking hx, who likely has undx COPD. ?Plan: ?--start prednisone 40 mg daily ?--DuoNeb scheduled ? ?# Atypical PNA ?--likely the trigger for COPD exacerbation.  Procal neg, could be viral.  Started on ceftriaxone and azithromycin in the ED. ?Plan: ?--cont ceftriaxone and azithromycin for now ?--obtain RVP ? ?# Acute hypoxemic respiratory failure ?--O2 sat dropped to 87% on room air, and pt was placed on 2L O2.   ?Plan: ?--Continue supplemental O2 to keep sats >=90%, wean as tolerated ? ?# Left flank pain  ?--worse with cough and sneezing and deep breath, likely due to muscle strain or spasm.  CT neg for kidney issues or fractures. ?Plan: ?--Flexeril PRN for pain ?--Advil PRN for pain ? ? ?DVT prophylaxis: Lovenox SQ ?Code Status: Full code  ?Family Communication:   ?Disposition Plan: home  ?Consults called: none ?Level of care: Med-Surg ? ? ?Review of Systems: As per HPI otherwise complete review of systems negative.  ? ?Past Medical History:  ?Diagnosis Date  ? Hypertension   ? Inflammatory arthritis   ? ? ?Past Surgical History:  ?Procedure Laterality Date  ? HEMORRHOID SURGERY    ? ? ? reports that he has been smoking cigarettes. He has been smoking an average of .5 packs per day. He has never used smokeless tobacco. He reports that he does not drink alcohol and does not use drugs. ? ?No Known Allergies ? ?Family  History  ?Problem Relation Age of Onset  ? Diabetes Mother   ? Diabetes Father   ? Hypertension Father   ? Arthritis Father   ? Diabetes Brother   ? ? ?Prior to Admission medications   ?Medication Sig Start Date End Date Taking? Authorizing Provider  ?albuterol (VENTOLIN HFA) 108 (90 Base) MCG/ACT inhaler Inhale 2 puffs into the lungs every 4 (four) hours as needed for wheezing or shortness of breath. 12/25/21  Yes Arta Silence, MD  ?azithromycin (ZITHROMAX Z-PAK) 250 MG tablet Take 2 tablets (500 mg) on  Day 1,  followed by 1 tablet (250 mg)  once daily on Days 2 through 5.  Start the day after the ER visit. 12/26/21 12/30/21 Yes Arta Silence, MD  ?ENBREL SURECLICK 50 MG/ML injection Inject 50 mg into the skin once a week.  07/24/16  Yes [provider]  ?fluticasone (FLONASE) 50 MCG/ACT nasal spray Place 2 sprays into both nostrils daily. 10/05/18  Yes Cannady, Jolene T, NP  ?hydrochlorothiazide (HYDRODIURIL) 25 MG tablet Take 0.5 tablets (12.5 mg total) by mouth daily. 09/05/21  Yes Cannady, Jolene T, NP  ?naproxen (NAPROSYN) 500 MG tablet Take 500 mg by mouth 2 (two) times daily. 04/22/16  Yes [provider]  ?omeprazole (PRILOSEC) 20 MG capsule Take 20 mg by mouth daily. 04/22/16  Yes [provider]  ?oxyCODONE-acetaminophen (PERCOCET) 5-325 MG tablet Take 1-2 tablets by mouth every 6 (six) hours as needed for up to 5 days for severe pain. 12/25/21 12/30/21 Yes Arta Silence, MD  ?predniSONE (DELTASONE) 20 MG tablet Take 3 tablets (60 mg total) by mouth daily with breakfast for 4 days. Start the day after the ER visit 12/26/21 12/30/21 Yes Arta Silence, MD  ?Tiotropium Bromide-Olodaterol 2.5-2.5 MCG/ACT AERS Inhale 2 puffs into the lungs daily. 10/17/21  Yes Marnee Guarneri T, NP  ?naloxone (NARCAN) nasal spray 4 mg/0.1 mL SMARTSIG:Both Nares 12/25/21   [provider]  ? ? ?Physical Exam: ?Vitals:  ? 12/26/21 1300 12/26/21 1330 12/26/21 1400 12/26/21 1430  ?BP: 124/72 131/72 136/70 122/79  ?Pulse: 66 64 70 69  ?Resp: '14 15 15 13  '$ ?Temp:      ?SpO2: 90% 91% 90% 92%  ?Weight:      ?Height:      ? ?Constitutional: NAD, AAOx3 ?HEENT: conjunctivae and lids normal, EOMI ?CV: No cyanosis.   ?RESP: increased RR, on 2L ?SKIN: warm, dry ?Neuro: II - XII grossly intact.   ?Psych: Normal mood and affect.  Appropriate judgement and reason ? ? ?Labs on Admission: I have personally reviewed following labs and imaging studies ? ?CBC: ?Recent Labs  ?Lab 12/25/21 ?0805 12/26/21 ?1129  ?WBC 5.9 11.5*  ?NEUTROABS 3.5 8.2*   ?HGB 16.4 15.8  ?HCT 48.4 47.7  ?MCV 95.3 96.0  ?PLT 195 222  ? ?Basic Metabolic Panel: ?Recent Labs  ?Lab 12/25/21 ?0805 12/26/21 ?9924  ?NA 136 138  ?K 4.1 4.1  ?CL 101 103  ?CO2 29 26  ?GLUCOSE 110* 137*  ?BUN 10 14  ?CREATININE 1.28* 1.10  ?CALCIUM 9.0 9.5  ? ?GFR: ?Estimated Creatinine Clearance: 82.5 mL/min (by C-G formula based on SCr of 1.1 mg/dL). ?Liver Function Tests: ?No results for input(s): AST, ALT, ALKPHOS, BILITOT, PROT, ALBUMIN in the last 168 hours. ?No results for input(s): LIPASE, AMYLASE in the last 168 hours. ?No results for input(s): AMMONIA in the last 168 hours. ?Coagulation Profile: ?No results for input(s): INR, PROTIME in the last 168 hours. ?Cardiac Enzymes: ?No results for  input(s): CKTOTAL, CKMB, CKMBINDEX, TROPONINI in the last 168 hours. ?BNP (last 3 results) ?No results for input(s): PROBNP in the last 8760 hours. ?HbA1C: ?No results for input(s): HGBA1C in the last 72 hours. ?CBG: ?No results for input(s): GLUCAP in the last 168 hours. ?Lipid Profile: ?No results for input(s): CHOL, HDL, LDLCALC, TRIG, CHOLHDL, LDLDIRECT in the last 72 hours. ?Thyroid Function Tests: ?No results for input(s): TSH, T4TOTAL, FREET4, T3FREE, THYROIDAB in the last 72 hours. ?Anemia Panel: ?No results for input(s): VITAMINB12, FOLATE, FERRITIN, TIBC, IRON, RETICCTPCT in the last 72 hours. ?Urine analysis: ?   ?Component Value Date/Time  ? COLORURINE STRAW (A) 12/25/2021 6720  ? APPEARANCEUR CLEAR (A) 12/25/2021 0834  ? LABSPEC 1.016 12/25/2021 0834  ? PHURINE 6.0 12/25/2021 0834  ? GLUCOSEU NEGATIVE 12/25/2021 0834  ? [place] NEGATIVE 12/25/2021 0834  ? [place] NEGATIVE 12/25/2021 0834  ? [place] NEGATIVE 12/25/2021 0834  ? [place] NEGATIVE 12/25/2021 0834  ? NITRITE NEGATIVE 12/25/2021 0834  ? LEUKOCYTESUR NEGATIVE 12/25/2021 0834  ? ? ?Radiological Exams on Admission: ?DG Chest 2 View ? ?Result Date: 12/25/2021 ?CLINICAL DATA:  Left rib pain, shortness of breath. EXAM: CHEST - 2 VIEW  COMPARISON:  September 05, 2021. FINDINGS: The heart size and mediastinal contours are within normal limits. Both lungs are clear. The visualized skeletal structures are unremarkable. IMPRESSION: No active cardiopulmonary

## 2021-12-26 NOTE — Plan of Care (Signed)

## 2021-12-26 NOTE — ED Notes (Signed)
O2 sat dropped and maintained 87% on RA, this RN placed pt on 2L [place] increased to 93% on 2L [place]. Dr. Cherylann Banas, MD notified.  ?

## 2021-12-27 DIAGNOSIS — J441 Chronic obstructive pulmonary disease with (acute) exacerbation: Secondary | ICD-10-CM | POA: Diagnosis not present

## 2021-12-27 DIAGNOSIS — J123 Human metapneumovirus pneumonia: Secondary | ICD-10-CM

## 2021-12-27 DIAGNOSIS — R109 Unspecified abdominal pain: Secondary | ICD-10-CM

## 2021-12-27 DIAGNOSIS — J9601 Acute respiratory failure with hypoxia: Secondary | ICD-10-CM

## 2021-12-27 LAB — CBC
HCT: 45.1 % (ref 39.0–52.0)
Hemoglobin: 15.1 g/dL (ref 13.0–17.0)
MCH: 32.1 pg (ref 26.0–34.0)
MCHC: 33.5 g/dL (ref 30.0–36.0)
MCV: 95.8 fL (ref 80.0–100.0)
Platelets: 218 10*3/uL (ref 150–400)
RBC: 4.71 MIL/uL (ref 4.22–5.81)
RDW: 13.1 % (ref 11.5–15.5)
WBC: 12.5 10*3/uL — ABNORMAL HIGH (ref 4.0–10.5)
nRBC: 0 % (ref 0.0–0.2)

## 2021-12-27 LAB — BASIC METABOLIC PANEL
Anion gap: 8 (ref 5–15)
BUN: 16 mg/dL (ref 8–23)
CO2: 27 mmol/L (ref 22–32)
Calcium: 9.3 mg/dL (ref 8.9–10.3)
Chloride: 105 mmol/L (ref 98–111)
Creatinine, Ser: 1 mg/dL (ref 0.61–1.24)
GFR, Estimated: >60 mL/min (ref >60)
Glucose, Bld: 110 mg/dL — ABNORMAL HIGH (ref 70–99)
Potassium: 4.1 mmol/L (ref 3.5–5.1)
Sodium: 140 mmol/L (ref 135–145)

## 2021-12-27 LAB — MAGNESIUM: Magnesium: 2.1 mg/dL (ref 1.7–2.4)

## 2021-12-27 MED ORDER — CYCLOBENZAPRINE HCL 10 MG PO TABS
10.0000 mg | ORAL_TABLET | Freq: Three times a day (TID) | ORAL | Status: DC
  Administered 2021-12-27: 10 mg via ORAL
  Filled 2021-12-27: qty 1

## 2021-12-27 MED ORDER — KETOROLAC TROMETHAMINE 15 MG/ML IJ SOLN
15.0000 mg | Freq: Three times a day (TID) | INTRAMUSCULAR | Status: DC | PRN
  Administered 2021-12-27: 15 mg via INTRAVENOUS
  Filled 2021-12-27: qty 1

## 2021-12-27 MED ORDER — CYCLOBENZAPRINE HCL 10 MG PO TABS
10.0000 mg | ORAL_TABLET | Freq: Three times a day (TID) | ORAL | Status: DC | PRN
  Administered 2021-12-27: 10 mg via ORAL
  Filled 2021-12-27: qty 1

## 2021-12-27 MED ORDER — ALBUTEROL SULFATE (2.5 MG/3ML) 0.083% IN NEBU: 3.0000 mL | INHALATION_SOLUTION | RESPIRATORY_TRACT | Status: DC | PRN

## 2021-12-27 MED ORDER — IPRATROPIUM-ALBUTEROL 0.5-2.5 (3) MG/3ML IN SOLN
3.0000 mL | Freq: Three times a day (TID) | RESPIRATORY_TRACT | Status: DC
  Administered 2021-12-27 – 2021-12-28 (×3): 3 mL via RESPIRATORY_TRACT
  Filled 2021-12-27 (×3): qty 3

## 2021-12-27 NOTE — Assessment & Plan Note (Addendum)
--  hypoxia, increased dyspnea, increased cough.  Imaging with emphysematous changes in a pt with extensive and current smoking hx, who likely has undx COPD. ?Plan: ?--cont prednisone 40 mg daily ?--DuoNeb scheduled ?--albuterol inhaler PRN ?

## 2021-12-27 NOTE — Assessment & Plan Note (Signed)
--  O2 sat dropped to 87% on room air, and pt was placed on 2L O2.   ?Plan: ?--Continue supplemental O2 to keep sats >=90%, wean as tolerated ?

## 2021-12-27 NOTE — Progress Notes (Signed)
?  Progress Note ? ? ?Patient: Hunter Huffman Sam WQV:[id number] DOB: 1961-04-09 DOA: 12/26/2021     0 ?DOS: the patient was seen and examined on 12/27/2021 ?  ?Brief hospital course: ?No notes on file ? ?Assessment and Plan: ?* COPD with acute exacerbation (HCC) ?--hypoxia, increased dyspnea, increased cough.  Imaging with emphysematous changes in a pt with extensive and current smoking hx, who likely has undx COPD. ?Plan: ?--cont prednisone 40 mg daily ?--DuoNeb scheduled ?--albuterol inhaler PRN ? ?Acute left flank pain ?--worse with cough and sneezing and deep breath, likely due to muscle strain or spasm.  CT neg for kidney issues or fractures. ?Plan: ?--Flexeril 10 mg TID ?--IV toradol PRN for pain ? ?Acute hypoxemic respiratory failure (HCC) ?--O2 sat dropped to 87% on room air, and pt was placed on 2L O2.   ?Plan: ?--Continue supplemental O2 to keep sats >=90%, wean as tolerated ? ?Pneumonia due to human metapneumovirus (hMPV) ?--likely the trigger for COPD exacerbation.  Procal neg.  Started on ceftriaxone and azithromycin in the ED. ?Plan: ?--d/c abx ?--supportive tx ? ? ? ? ?  ? ?Subjective:  ?Pt reported flank pain persisted.   ? ? ?Physical Exam: ? ?Constitutional: NAD, AAOx3 ?HEENT: conjunctivae and lids normal, EOMI ?CV: No cyanosis.   ?RESP: normal respiratory effort, on RA ?Neuro: II - XII grossly intact.   ?Psych: Normal mood and affect.  Appropriate judgement and reason ? ? ?Data Reviewed: ? ?Family Communication:  ? ?Disposition: ?Status is: Observation ? ? Planned Discharge Destination: Home ? ? ? ?Time spent: 50 minutes ? ?Author: ?Enzo Bi, MD ?12/27/2021 7:59 PM ? ?For on call review [email or web address].  ?

## 2021-12-27 NOTE — Assessment & Plan Note (Addendum)
--  likely the trigger for COPD exacerbation.  Procal neg.  Started on ceftriaxone and azithromycin in the ED. ?Plan: ?--d/c abx ?--supportive tx ?

## 2021-12-27 NOTE — Assessment & Plan Note (Addendum)
--  worse with cough and sneezing and deep breath, likely due to muscle strain or spasm.  CT neg for kidney issues or fractures. ?Plan: ?--Flexeril 10 mg TID ?--IV toradol PRN for pain ?

## 2021-12-28 DIAGNOSIS — J441 Chronic obstructive pulmonary disease with (acute) exacerbation: Secondary | ICD-10-CM | POA: Diagnosis not present

## 2021-12-28 LAB — CBC
HCT: 40.1 % (ref 39.0–52.0)
Hemoglobin: 13.2 g/dL (ref 13.0–17.0)
MCH: 31.9 pg (ref 26.0–34.0)
MCHC: 32.9 g/dL (ref 30.0–36.0)
MCV: 96.9 fL (ref 80.0–100.0)
Platelets: 205 10*3/uL (ref 150–400)
RBC: 4.14 MIL/uL — ABNORMAL LOW (ref 4.22–5.81)
RDW: 13 % (ref 11.5–15.5)
WBC: 9.3 10*3/uL (ref 4.0–10.5)
nRBC: 0 % (ref 0.0–0.2)

## 2021-12-28 LAB — BASIC METABOLIC PANEL
Anion gap: 7 (ref 5–15)
BUN: 19 mg/dL (ref 8–23)
CO2: 29 mmol/L (ref 22–32)
Calcium: 9 mg/dL (ref 8.9–10.3)
Chloride: 103 mmol/L (ref 98–111)
Creatinine, Ser: 1.13 mg/dL (ref 0.61–1.24)
GFR, Estimated: >60 mL/min (ref >60)
Glucose, Bld: 93 mg/dL (ref 70–99)
Potassium: 4.2 mmol/L (ref 3.5–5.1)
Sodium: 139 mmol/L (ref 135–145)

## 2021-12-28 LAB — MAGNESIUM: Magnesium: 2.3 mg/dL (ref 1.7–2.4)

## 2021-12-28 MED ORDER — PREDNISONE 20 MG PO TABS: 40.0000 mg | ORAL_TABLET | Freq: Every day | ORAL | 0 refills | Status: DC

## 2021-12-28 MED ORDER — POLYETHYLENE GLYCOL 3350 17 G PO PACK
17.0000 g | PACK | Freq: Two times a day (BID) | ORAL | 0 refills | Status: DC | PRN
Start: 2021-12-28 — End: 2022-01-04

## 2021-12-28 MED ORDER — HYDROCODONE BIT-HOMATROP MBR 5-1.5 MG/5ML PO SOLN
5.0000 mL | Freq: Four times a day (QID) | ORAL | 0 refills | Status: DC | PRN
Start: 2021-12-28 — End: 2022-01-04

## 2021-12-28 NOTE — TOC Progression Note (Addendum)
Transition of Care (TOC) - Progression Note  ? ? ?Patient Details  ?Name: Hunter Huffman ?MRN: [id number] ?Date of Birth: 1961-08-31 ? ?Transition of Care (TOC) CM/SW Contact  ?Conception Oms, RN ?Phone Number: ?12/28/2021, 9:09 AM ? ?Clinical Narrative:    ?Per MD no Home Oxygen needs ? ?  ?Transition of Care (TOC) Screening Note ? ? ?Patient Details  ?Name: Hunter Huffman ?Date of Birth: 17-Mar-1961 ? ? ?Transition of Care (TOC) CM/SW Contact:    ?Conception Oms, RN ?Phone Number: ?12/28/2021, 9:46 AM ? ? ? ?Transition of Care Department [facility]) has reviewed patient and no TOC needs have been identified at this time. We will continue to monitor patient advancement through interdisciplinary progression rounds. If new patient transition needs arise, please place a TOC consult. ?  ?  ? ?Expected Discharge Plan and Services ?  ?  ?  ?  ?  ?                ?  ?  ?  ?  ?  ?  ?  ?  ?  ?  ? ? ?Social Determinants of Health (SDOH) Interventions ?  ? ?Readmission Risk Interventions ?   ? View : No data to display.  ?  ?  ?  ? ? ?

## 2021-12-28 NOTE — Discharge Summary (Signed)
? ?Physician Discharge Summary ? ? ?Hunter Huffman  male DOB: December 04, 1960  ?[id number]:[id number] ? ?PCP: Venita Lick, NP ? ?Admit date: 12/26/2021 ?Discharge date: 12/28/2021 ? ?Admitted From: home ?Disposition:  home ?CODE STATUS: Full code ? ?Hospital Course:  ?For full details, please see H&P, progress notes, consult notes and ancillary notes.  ?Briefly,  ?Hunter Huffman is a 61 y.o. male with medical history significant of HTN and smoking who presented for the 2nd time in 2 days for dyspnea and pain in his left flank.  ?  ?Acute hypoxemic respiratory failure (HCC) ?--O2 sat dropped to 87% on room air, and pt was placed on 2L O2.   ?--dyspnea and hypoxia 2/2 viral PNA and COPD exacerbation.   ?--pt was quickly weaned to room air. ? ?Pneumonia due to human metapneumovirus (hMPV) ?--Pt felt like he had a cold over the weekend and had dyspnea and cough.  RVP pos for metapneumovirus. ?--Procal neg.  Started on ceftriaxone and azithromycin in the ED, which were not continued. ? ?* COPD with acute exacerbation (HCC) ?--hypoxia, increased dyspnea, increased cough.  Imaging with emphysematous changes in a pt with extensive and current smoking hx, who likely has undx COPD.  Likely has COPD exacerbation triggered by metapneumovirus infection.   ?--pt was started on prednisone 40 mg daily and discharged to finish 2 more days. (Pt already received Rx for prednisone from the ED). ?--received DuoNeb scheduled while inpatient and discharged on home bronchodilator. ?  ?Acute left flank pain ?--worse with cough and sneezing and deep breath, likely due to muscle strain or spasm.  CTA c/a/p done in the ED neg for dissection, kidney issues or fractures.  Flexeril and IV toradol given with no improvement.  Pt said he would find ways to help with his pain after he returns home.  Recommended heat pad. ? ? ?Discharge Diagnoses:  ?Principal Problem: ?  COPD with acute exacerbation ([place]) ?Active Problems: ?  Pneumonia due to human  metapneumovirus (hMPV) ?  Acute hypoxemic respiratory failure ([place]) ?  Acute left flank pain ? ? ? ? ?Discharge Instructions: ? ?Allergies as of 12/28/2021   ?No Known Allergies ?  ? ?  ?Medication List  ?  ? ?STOP taking these medications   ? ?azithromycin 250 MG tablet ?Commonly known as: Zithromax Z-Pak ?  ? ?  ? ?TAKE these medications   ? ?albuterol 108 (90 Base) MCG/ACT inhaler ?Commonly known as: VENTOLIN HFA ?Inhale 2 puffs into the lungs every 4 (four) hours as needed for wheezing or shortness of breath. ?  ?Enbrel SureClick 50 MG/ML injection ?Generic drug: etanercept ?Inject 50 mg into the skin once a week. ?  ?fluticasone 50 MCG/ACT nasal spray ?Commonly known as: FLONASE ?Place 2 sprays into both nostrils daily. ?  ?hydrochlorothiazide 25 MG tablet ?Commonly known as: HYDRODIURIL ?Take 0.5 tablets (12.5 mg total) by mouth daily. ?  ?HYDROcodone bit-homatropine 5-1.5 MG/5ML syrup ?Commonly known as: HYCODAN ?Take 5 mLs by mouth every 6 (six) hours as needed for cough. ?  ?naloxone 4 MG/0.1ML Liqd nasal spray kit ?Commonly known as: NARCAN ?SMARTSIG:Both Nares ?  ?naproxen 500 MG tablet ?Commonly known as: NAPROSYN ?Take 500 mg by mouth 2 (two) times daily. ?  ?omeprazole 20 MG capsule ?Commonly known as: PRILOSEC ?Take 20 mg by mouth daily. ?  ?oxyCODONE-acetaminophen 5-325 MG tablet ?Commonly known as: Percocet ?Take 1-2 tablets by mouth every 6 (six) hours as needed for up to 5 days for severe pain. ?  ?  polyethylene glycol 17 g packet ?Commonly known as: MIRALAX / GLYCOLAX ?Take 17 g by mouth 2 (two) times daily as needed. ?  ?predniSONE 20 MG tablet ?Commonly known as: DELTASONE ?Take 2 tablets (40 mg total) by mouth daily with breakfast for 2 days. ?Start taking on: December 29, 2021 ?What changed:  ?how much to take ?additional instructions ?  ?Tiotropium Bromide-Olodaterol 2.5-2.5 MCG/ACT Aers ?Inhale 2 puffs into the lungs daily. ?  ? ?  ? ? ? Follow-up Information   ? ? Marnee Guarneri T, NP Follow  up in 1 week(s).   ?Specialty: Nurse Practitioner ?Contact information: ?[address] ?[place] [address] ?[phone] ? ? ?  ?  ? ?  ?  ? ?  ? ? ?No Known Allergies ? ? ?The results of significant diagnostics from this hospitalization (including imaging, microbiology, ancillary and laboratory) are listed below for reference.  ? ?Consultations: ? ? ?Procedures/Studies: ?DG Chest 2 View ? ?Result Date: 12/25/2021 ?CLINICAL DATA:  Left rib pain, shortness of breath. EXAM: CHEST - 2 VIEW COMPARISON:  September 05, 2021. FINDINGS: The heart size and mediastinal contours are within normal limits. Both lungs are clear. The visualized skeletal structures are unremarkable. IMPRESSION: No active cardiopulmonary disease. Electronically Signed   By: Marijo Conception M.D.   On: 12/25/2021 08:26  ? ?DG Chest Portable 1 View ? ?Result Date: 12/26/2021 ?CLINICAL DATA:  Worsening shortness of breath since yesterday EXAM: PORTABLE CHEST 1 VIEW COMPARISON:  Radiograph 12/25/2021 FINDINGS: Unchanged cardiomediastinal silhouette. Reticulonodular opacities in the mid to lower lungs best seen on recent chest CT, not significantly changed radiographically. No new airspace disease. No large pleural effusion. No visible pneumothorax. No acute osseous abnormality. IMPRESSION: Unchanged mid to lower lung reticulonodular opacities consistent with an infectious/inflammatory process. No new airspace disease. Electronically Signed   By: Maurine Simmering M.D.   On: 12/26/2021 08:15  ? ?CT Angio Chest/Abd/Pel for Dissection W and/or Wo Contrast ? ?Result Date: 12/25/2021 ?CLINICAL DATA:  Chest and back pain, aortic dissection suspected pain shortness of breath, upper respiratory illness, EXAM: CT ANGIOGRAPHY CHEST, ABDOMEN AND PELVIS TECHNIQUE: Non-contrast CT of the chest was initially obtained. Multidetector CT imaging through the chest, abdomen and pelvis was performed using the standard protocol during bolus administration of intravenous contrast.  Multiplanar reconstructed images and MIPs were obtained and reviewed to evaluate the vascular anatomy. RADIATION DOSE REDUCTION: This exam was performed according to the departmental dose-optimization program which includes automated exposure control, adjustment of the mA and/or kV according to patient size and/or use of iterative reconstruction technique. CONTRAST:  165m OMNIPAQUE IOHEXOL 350 MG/ML SOLN COMPARISON:  None. FINDINGS: CTA CHEST FINDINGS VASCULAR Aorta: Satisfactory opacification of the aorta. Normal contour and caliber of the thoracic aorta. No evidence of aneurysm, dissection, or other acute aortic pathology. Mild, predominantly noncalcific aortic atherosclerosis. Cardiovascular: No evidence of pulmonary embolism on limited non-tailored examination. Normal heart size. No pericardial effusion. Review of the MIP images confirms the above findings. NON VASCULAR Mediastinum/Nodes: No enlarged mediastinal, hilar, or axillary lymph nodes. Thyroid gland, trachea, and esophagus demonstrate no significant findings. Lungs/Pleura: Moderate centrilobular emphysema. Diffuse bilateral bronchial wall thickening. Diffuse heterogeneous, centrilobular, and tree-in-bud nodularity throughout the lungs, most conspicuously in the lung bases (series 8, image 107). No pleural effusion or pneumothorax. Musculoskeletal: No chest wall abnormality. No acute osseous findings. Review of the MIP images confirms the above findings. CTA ABDOMEN AND PELVIS FINDINGS VASCULAR Normal contour and caliber of the abdominal aorta. No evidence  of aneurysm, dissection, or other acute aortic pathology. 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 Otherwise standard branching pattern of the abdominal aorta with solitary left renal artery. Moderate mixed calcific atherosclerosis. Review of the MIP images confirms the above findings. NON-VASCULAR Hepatobiliary: No solid liver abnormality is seen. No  gallstones, gallbladder wall thickening, or biliary dilatation. Pancreas: Unremarkable. No pancreatic ductal dilatation or surrounding inflammatory changes. Spleen: Normal in size without significant abnormality. Adre

## 2021-12-28 NOTE — Plan of Care (Signed)

## 2021-12-28 NOTE — Progress Notes (Signed)
AVS packet given to patient and discussed changes in medications.  Pt understands information and called his daughter to pick him up.  IV removed and pt taken down to medical mall entrance by charge nurse. ?

## 2021-12-30 NOTE — Patient Instructions (Signed)
COPD and Physical Activity ?Chronic obstructive pulmonary disease (COPD) is a long-term, or chronic, condition that affects the lungs. COPD is a general term that can be used to describe many problems that cause inflammation of the lungs and limit airflow. These conditions include chronic bronchitis and emphysema. ?The main symptom of COPD is shortness of breath, which makes it harder to do even simple tasks. This can also make it harder to exercise and stay active. Talk with your health care provider about treatments to help you breathe better and actions you can take to prevent breathing problems during physical activity. ?What are the benefits of exercising when you have COPD? ?Exercising regularly is an important part of a healthy lifestyle. You can still exercise and do physical activities even though you have COPD. Exercise and physical activity improve your shortness of breath by increasing blood flow (circulation). This causes your heart to pump more oxygen through your body. Moderate exercise can: ?Improve oxygen use. ?Increase your energy level. ?Help with shortness of breath. ?Strengthen your breathing muscles. ?Improve heart health. ?Help with sleep. ?Improve your self-esteem and feelings of self-worth. ?Lower depression, stress, and anxiety. ?Exercise can benefit everyone with COPD. The severity of your disease may affect how hard you can exercise, especially at first, but everyone can benefit. Talk with your health care provider about how much exercise is safe for you, and which activities and exercises are safe for you. ?What actions can I take to prevent breathing problems during physical activity? ?Sign up for a pulmonary rehabilitation program. This type of program may include: ?Education about lung diseases. ?Exercise classes that teach you how to exercise and be more active while improving your breathing. This usually involves: ?Exercise using your lower extremities, such as a stationary  bicycle. ?About 30 minutes of exercise, 2 to 5 times per week, for 6 to 12 weeks. ?Strength training, such as push-ups or leg lifts. ?Nutrition education. ?Group classes in which you can talk with others who also have COPD and learn ways to manage stress. ?If you use an oxygen tank, you should use it while you exercise. Work with your health care provider to adjust your oxygen for your physical activity. Your resting flow rate is different from your flow rate during physical activity. ?How to manage your breathing while exercising ?While you are exercising: ?Take slow breaths. ?Pace yourself, and do nottry to go too fast. ?Purse your lips while breathing out. Pursing your lips is similar to a kissing or whistling position. ?If doing exercise that uses a quick burst of effort, such as weight lifting: ?Breathe in before starting the exercise. ?Breathe out during the hardest part of the exercise, such as raising the weights. ?Where to find support ?You can find support for exercising with COPD from: ?Your health care provider. ?A pulmonary rehabilitation program. ?Your local health department or community health programs. ?Support groups, either online or in-person. Your health care provider may be able to recommend support groups. ?Where to find more information ?You can find more information about exercising with COPD from: ?American Lung Association: lung.org ?COPD Foundation: copdfoundation.org ?Contact a health care provider if: ?Your symptoms get worse. ?You have nausea. ?You have a fever. ?You want to start a new exercise program or a new activity. ?Get help right away if: ?You have chest pain. ?You cannot breathe. ?These symptoms may represent a serious problem that is an emergency. Do not wait to see if the symptoms will go away. Get medical help right away. Call   your local emergency services (911 in the [place] Do not drive yourself to the hospital. ?Summary ?COPD is a general term that can be used to describe  many different lung problems that cause lung inflammation and limit airflow. This includes chronic bronchitis and emphysema. ?Exercise and physical activity improve your shortness of breath by increasing blood flow (circulation). This causes your heart to provide more oxygen to your body. ?Contact your health care provider before starting any exercise program or new activity. Ask your health care provider what exercises and activities are safe for you. ?This information is not intended to replace advice given to you by your health care provider. Make sure you discuss any questions you have with your health care provider. ?Document Revised: 06/27/2020 Document Reviewed: 06/27/2020 ?Elsevier Patient Education ? 2023 [employer] ? ?

## 2022-01-04 ENCOUNTER — Ambulatory Visit (INDEPENDENT_AMBULATORY_CARE_PROVIDER_SITE_OTHER): Payer: Medicare Other | Admitting: Nurse Practitioner

## 2022-01-04 ENCOUNTER — Encounter: Payer: Self-pay | Admitting: Nurse Practitioner

## 2022-01-04 VITALS — BP 138/87 | HR 67 | Temp 98.5°F | Wt 213.2 lb

## 2022-01-04 DIAGNOSIS — E66811 Obesity, class 1: Secondary | ICD-10-CM

## 2022-01-04 DIAGNOSIS — J123 Human metapneumovirus pneumonia: Secondary | ICD-10-CM | POA: Diagnosis not present

## 2022-01-04 DIAGNOSIS — F17210 Nicotine dependence, cigarettes, uncomplicated: Secondary | ICD-10-CM

## 2022-01-04 DIAGNOSIS — J9601 Acute respiratory failure with hypoxia: Secondary | ICD-10-CM

## 2022-01-04 DIAGNOSIS — E6609 Other obesity due to excess calories: Secondary | ICD-10-CM

## 2022-01-04 DIAGNOSIS — R1012 Left upper quadrant pain: Secondary | ICD-10-CM | POA: Insufficient documentation

## 2022-01-04 DIAGNOSIS — I700 Atherosclerosis of aorta: Secondary | ICD-10-CM | POA: Diagnosis not present

## 2022-01-04 DIAGNOSIS — J432 Centrilobular emphysema: Secondary | ICD-10-CM

## 2022-01-04 DIAGNOSIS — Z6830 Body mass index (BMI) 30.0-30.9, adult: Secondary | ICD-10-CM

## 2022-01-04 DIAGNOSIS — E669 Obesity, unspecified: Secondary | ICD-10-CM | POA: Insufficient documentation

## 2022-01-04 MED ORDER — ALBUTEROL SULFATE HFA 108 (90 BASE) MCG/ACT IN AERS: 2.0000 | INHALATION_SPRAY | RESPIRATORY_TRACT | 4 refills | Status: DC | PRN

## 2022-01-04 NOTE — Assessment & Plan Note (Signed)
Noted on CT imaging 11/13/21.  Recommend continued cessation of smoking, quit 2 weeks ago, and will discuss addition of statin in future for cholesterol control and prevention. ?

## 2022-01-04 NOTE — Assessment & Plan Note (Signed)
Acute and improved.  Plan to recheck labs today to include CMP, lipase, and amylase.  Monitor diet at home. ?

## 2022-01-04 NOTE — Assessment & Plan Note (Addendum)
Acute and improving.  Repeat CBC and CMP today.  Will plan on recheck CXR at next visit 02/21/22. ?

## 2022-01-04 NOTE — Assessment & Plan Note (Signed)
Acute and improved at this time, continue cessation of smoking and current inhalers.  Labs today. ?

## 2022-01-04 NOTE — Progress Notes (Signed)
? ?BP 138/87   Pulse 67   Temp 98.5 ?F (36.9 ?C) (Oral)   Wt 213 lb 3.2 oz (96.7 kg)   SpO2 95%   BMI 30.59 kg/m?   ? ?Subjective:  ? ? Patient ID: Hunter Huffman, male    DOB: 02-Mar-1961, 61 y.o.   MRN: [id number] ? ?HPI: ?Hunter Huffman is a 61 y.o. male ? ?Chief Complaint  ?Patient presents with  ? Pneumonia  ? Emphysema  ? ?Transition of Care Hospital Follow up.  ?Was admitted to [facility] on 12/26/21 and discharged 12/28/21 for acute left flank pain.  Diagnosed with pneumonia and COPD exacerbation + acute hypoxemic respiratory failure.  Treated with Rocephin and Zpack + Prednisone.  Pain to LUQ improved at this time, just sore today.  Overall feeling better. ? ?"Hospital Course:  ?For full details, please see H&P, progress notes, consult notes and ancillary notes.  ?Briefly,  ?Hunter Huffman is a 61 y.o. male with medical history significant of HTN and smoking who presented for the 2nd time in 2 days for dyspnea and pain in his left flank.  ?  ?Acute hypoxemic respiratory failure (HCC) ?--O2 sat dropped to 87% on room air, and pt was placed on 2L O2.   ?--dyspnea and hypoxia 2/2 viral PNA and COPD exacerbation.   ?--pt was quickly weaned to room air. ?  ?Pneumonia due to human metapneumovirus (hMPV) ?--Pt felt like he had a cold over the weekend and had dyspnea and cough.  RVP pos for metapneumovirus. ?--Procal neg.  Started on ceftriaxone and azithromycin in the ED, which were not continued. ?  ?* COPD with acute exacerbation (HCC) ?--hypoxia, increased dyspnea, increased cough.  Imaging with emphysematous changes in a pt with extensive and current smoking hx, who likely has undx COPD.  Likely has COPD exacerbation triggered by metapneumovirus infection.   ?--pt was started on prednisone 40 mg daily and discharged to finish 2 more days. (Pt already received Rx for prednisone from the ED). ?--received DuoNeb scheduled while inpatient and discharged on home bronchodilator. ?  ?Acute left flank pain ?--worse  with cough and sneezing and deep breath, likely due to muscle strain or spasm.  CTA c/a/p done in the ED neg for dissection, kidney issues or fractures.  Flexeril and IV toradol given with no improvement.  Pt said he would find ways to help with his pain after he returns home.  Recommended heat pad. ?  ?  ?Discharge Diagnoses:  ?Principal Problem: ?  COPD with acute exacerbation ([place]) ?Active Problems: ?  Pneumonia due to human metapneumovirus (hMPV) ?  Acute hypoxemic respiratory failure ([place]) ?  Acute left flank pain" ? ?Hospital/Facility: [facility] ?D/C Physician: Dr. Billie Ruddy ?D/C Date: 12/28/21 ? ?Records Requested: 01/04/22 ?Records Received: 01/04/22 ?Records Reviewed: 01/04/22 ? ?Diagnoses on Discharge: COPD with acute exacerbation  ? ?Date of interactive Contact within 48 hours of discharge:  ?Contact was through:  none present ? ?Date of 7 day or 14 day face-to-face visit:    within 14 days ? ?Outpatient Encounter Medications as of 01/04/2022  ?Medication Sig  ? ENBREL SURECLICK 50 MG/ML injection Inject 50 mg into the skin once a week.   ? fluticasone (FLONASE) 50 MCG/ACT nasal spray Place 2 sprays into both nostrils daily.  ? hydrochlorothiazide (HYDRODIURIL) 25 MG tablet Take 0.5 tablets (12.5 mg total) by mouth daily.  ? naproxen (NAPROSYN) 500 MG tablet Take 500 mg by mouth 2 (two) times daily.  ? omeprazole (PRILOSEC) 20 MG  capsule Take 20 mg by mouth daily.  ? Tiotropium Bromide-Olodaterol 2.5-2.5 MCG/ACT AERS Inhale 2 puffs into the lungs daily.  ? [DISCONTINUED] albuterol (VENTOLIN HFA) 108 (90 Base) MCG/ACT inhaler Inhale 2 puffs into the lungs every 4 (four) hours as needed for wheezing or shortness of breath.  ? albuterol (VENTOLIN HFA) 108 (90 Base) MCG/ACT inhaler Inhale 2 puffs into the lungs every 4 (four) hours as needed for wheezing or shortness of breath.  ? [EXPIRED] oxyCODONE-acetaminophen (PERCOCET) 5-325 MG tablet Take 1-2 tablets by mouth every 6 (six) hours as needed for up to 5 days for severe pain.   ? [DISCONTINUED] HYDROcodone bit-homatropine (HYCODAN) 5-1.5 MG/5ML syrup Take 5 mLs by mouth every 6 (six) hours as needed for cough. (Patient not taking: Reported on 01/04/2022)  ? [DISCONTINUED] naloxone (NARCAN) nasal spray 4 mg/0.1 mL SMARTSIG:Both Nares (Patient not taking: Reported on 01/04/2022)  ? [DISCONTINUED] polyethylene glycol (MIRALAX / GLYCOLAX) 17 g packet Take 17 g by mouth 2 (two) times daily as needed. (Patient not taking: Reported on 01/04/2022)  ? [DISCONTINUED] predniSONE (DELTASONE) 20 MG tablet Take 2 tablets (40 mg total) by mouth daily with breakfast for 2 days.  ? ?No facility-administered encounter medications on file as of 01/04/2022.  ? ? ?Diagnostic Tests Reviewed/Disposition: reviewed within chart -- CMP and pancreas labs not done ? ?Consults: none ? ?Discharge Instructions: Follow-up with PCP ? ?Disease/illness Education: Reviewed with patient at bedside ? ?Home Health/Community Services Discussions/Referrals: None ? ?Establishment or re-establishment of referral orders for community resources: None ? ?Discussion with other health care providers: Reviewed notes within chart ? ?Assessment and Support of treatment regimen adherence: Reviewed with patient at bedside ? ?Appointments Coordinated with: Reviewed with patient at bedside ? ?Education for self-management, independent living, and ADLs:  Reviewed with patient at bedside ? ?COPD ?Has not smoked in 2 weeks.  Taking Stiolto, started on this 10/17/21 and reports overall improvement in ADLs.  Can walk longer distances without SOB and has more energy.  Has Albuterol to use as needed. ? ?Has initial lung CA CT screening on 11/13/21 noting aortic atherosclerosis and moderate centrilobular emphysema. ?COPD status: stable ?Satisfied with current treatment?: yes ?Oxygen use: no ?Dyspnea frequency: improving ?Cough frequency: improving ?Rescue inhaler frequency:   ?Limitation of activity: no ?Productive cough: none ?Last Spirometry:  10/17/21 ?Pneumovax: Up to Date ?Influenza: Up to Date  ? ?Relevant past medical, surgical, family and social history reviewed and updated as indicated. Interim medical history since our last visit reviewed. ?Allergies and medications reviewed and updated. ? ?Review of Systems  ?Constitutional:  Negative for activity change, appetite change, diaphoresis, fatigue and unexpected weight change.  ?Respiratory:  Negative for cough, chest tightness, shortness of breath and wheezing.   ?Cardiovascular:  Negative for chest pain, palpitations and leg swelling.  ?Gastrointestinal: Negative.   ?Skin: Negative.   ?Neurological: Negative.   ?Psychiatric/Behavioral: Negative.    ? ?Per HPI unless specifically indicated above ? ?   ?Objective:  ?  ?BP 138/87   Pulse 67   Temp 98.5 ?F (36.9 ?C) (Oral)   Wt 213 lb 3.2 oz (96.7 kg)   SpO2 95%   BMI 30.59 kg/m?   ?Wt Readings from Last 3 Encounters:  ?01/04/22 213 lb 3.2 oz (96.7 kg)  ?12/26/21 214 lb 4.6 oz (97.2 kg)  ?10/17/21 214 lb 3.2 oz (97.2 kg)  ?  ?Physical Exam ?Vitals and nursing note reviewed.  ?Constitutional:   ?   General: He is awake. He is not  in acute distress. ?   Appearance: He is well-developed and well-groomed. He is obese. He is not ill-appearing or toxic-appearing.  ?HENT:  ?   Head: Normocephalic and atraumatic.  ?   Right Ear: Hearing normal. No drainage.  ?   Left Ear: Hearing normal. No drainage.  ?Eyes:  ?   General: Lids are normal.     ?   Right eye: No discharge.     ?   Left eye: No discharge.  ?   Conjunctiva/sclera: Conjunctivae normal.  ?   Pupils: Pupils are equal, round, and reactive to light.  ?Neck:  ?   Thyroid: No thyromegaly.  ?   Vascular: No carotid bruit.  ?Cardiovascular:  ?   Rate and Rhythm: Normal rate and regular rhythm.  ?   Heart sounds: Normal heart sounds, S1 normal and S2 normal. No murmur heard. ?  No gallop.  ?Pulmonary:  ?   Effort: Pulmonary effort is normal. No accessory muscle usage or respiratory distress.  ?   Breath  sounds: Normal breath sounds.  ?Abdominal:  ?   General: Bowel sounds are normal. There is no distension.  ?   Palpations: Abdomen is soft.  ?   Tenderness: There is no abdominal tenderness.  ?Musculoskeletal:     ?   General

## 2022-01-04 NOTE — Assessment & Plan Note (Addendum)
Chronic, ongoing with reported improvement symptoms with inhaler added.  Spirometry last visit FEV1 32% and FEV1/FVC 57%.  Recommend continued cessation of smoking, quit 2 weeks ago.  Will continue Stiolto daily, educated him on this and importance of use due to progressive nature of disease, plus continue Albuterol as needed -- refills sent.  Lung cancer screening annually, next 11/14/22.  Check Alpha 1 Antitrypsin lab today. ?

## 2022-01-04 NOTE — Assessment & Plan Note (Signed)
Quit 2 weeks ago, recommend continued cessation.  He reports feeling much better without smoking. ?

## 2022-01-04 NOTE — Assessment & Plan Note (Signed)
BMI 30.59.  Recommended eating smaller high protein, low fat meals more frequently and exercising 30 mins a day 5 times a week with a goal of 10-15lb weight loss in the next 3 months. Patient voiced their understanding and motivation to adhere to these recommendations. ? ?

## 2022-01-06 NOTE — Progress Notes (Signed)
Contacted via [place] ? ? ?Good afternoon Brahm, your labs have returned: ?- CBC improved with no anemia or infection showing. ?- Kidney function, creatinine and eGFR, remains normal, as is liver function, AST and ALT.   ?- Pancreas labs, lipase and amylase, are normal. ?- Waiting on Alpha 1 Antitrypsin labs, I will alert you when these return.  Any questions? ?Keep being stellar!!  Thank you for allowing me to participate in your care.  I appreciate you. ?Kindest regards, ?Christain Mcraney ?

## 2022-01-08 ENCOUNTER — Ambulatory Visit: Payer: Medicare Other | Admitting: Nurse Practitioner

## 2022-01-08 ENCOUNTER — Other Ambulatory Visit: Payer: Self-pay | Admitting: Nurse Practitioner

## 2022-01-08 MED ORDER — PROAIR RESPICLICK 108 (90 BASE) MCG/ACT IN AEPB: 2.0000 | INHALATION_SPRAY | Freq: Four times a day (QID) | RESPIRATORY_TRACT | 5 refills | Status: DC | PRN

## 2022-01-08 NOTE — Progress Notes (Signed)
Albuterol HFA covered ?

## 2022-01-17 LAB — CBC WITH DIFFERENTIAL/PLATELET
Basophils Absolute: 0.1 10*3/uL (ref 0.0–0.2)
Basos: 1 %
EOS (ABSOLUTE): 0.2 10*3/uL (ref 0.0–0.4)
Eos: 2 %
Hematocrit: 47.5 % (ref 37.5–51.0)
Hemoglobin: 15.9 g/dL (ref 13.0–17.7)
Immature Grans (Abs): 0.1 10*3/uL (ref 0.0–0.1)
Immature Granulocytes: 1 %
Lymphocytes Absolute: 3.2 10*3/uL — ABNORMAL HIGH (ref 0.7–3.1)
Lymphs: 32 %
MCH: 32.1 pg (ref 26.6–33.0)
MCHC: 33.5 g/dL (ref 31.5–35.7)
MCV: 96 fL (ref 79–97)
Monocytes Absolute: 0.9 10*3/uL (ref 0.1–0.9)
Monocytes: 9 %
Neutrophils Absolute: 5.4 10*3/uL (ref 1.4–7.0)
Neutrophils: 55 %
Platelets: 368 10*3/uL (ref 150–450)
RBC: 4.96 x10E6/uL (ref 4.14–5.80)
RDW: 12.6 % (ref 11.6–15.4)
WBC: 10 10*3/uL (ref 3.4–10.8)

## 2022-01-17 LAB — COMPREHENSIVE METABOLIC PANEL
ALT: 31 IU/L (ref 0–44)
AST: 21 IU/L (ref 0–40)
Albumin/Globulin Ratio: 1.9 (ref 1.2–2.2)
Albumin: 4.1 g/dL (ref 3.8–4.8)
Alkaline Phosphatase: 79 IU/L (ref 44–121)
BUN/Creatinine Ratio: 11 (ref 10–24)
BUN: 13 mg/dL (ref 8–27)
Bilirubin Total: 0.5 mg/dL (ref 0.0–1.2)
CO2: 26 mmol/L (ref 20–29)
Calcium: 9.5 mg/dL (ref 8.6–10.2)
Chloride: 100 mmol/L (ref 96–106)
Creatinine, Ser: 1.23 mg/dL (ref 0.76–1.27)
Globulin, Total: 2.2 g/dL (ref 1.5–4.5)
Glucose: 94 mg/dL (ref 70–99)
Potassium: 4.7 mmol/L (ref 3.5–5.2)
Sodium: 138 mmol/L (ref 134–144)
Total Protein: 6.3 g/dL (ref 6.0–8.5)
eGFR: 67 mL/min/{1.73_m2} (ref >59)

## 2022-01-17 LAB — ALPHA-1-ANTITRYPSIN DEFICIENCY

## 2022-01-17 LAB — AMYLASE: Amylase: 55 U/L (ref 31–110)

## 2022-01-17 LAB — LIPASE: Lipase: 19 U/L (ref 13–78)

## 2022-01-17 NOTE — Progress Notes (Signed)
Contacted via MyChart    Alpha 1 antitrypsin labs returned with no detection.  Great news!!

## 2022-02-17 DIAGNOSIS — R7309 Other abnormal glucose: Secondary | ICD-10-CM | POA: Insufficient documentation

## 2022-02-17 NOTE — Patient Instructions (Signed)
Eating Plan for Chronic Obstructive Pulmonary Disease Chronic obstructive pulmonary disease (COPD) causes symptoms such as shortness of breath, coughing, and chest discomfort. These symptoms can make it difficult to eat enough to maintain a healthy weight. Generally, people with COPD should eat a diet that is high in calories, protein, and other nutrients to maintain body weight and to keep the lungs as healthy as possible. Depending on the medicines you take and other health conditions you may have, your health care provider may give you additional recommendations on what to eat or avoid. Talk with your health care provider about your goals for body weight, and work with a dietitian to develop an eating plan that is right for you. What are tips for following this plan? Reading food labels  Avoid foods with more than 300 milligrams (mg) of salt (sodium) per serving. Choose foods that contain at least 4 grams (g) of fiber per serving. Try to eat 20-30 g of fiber each day. Choose foods that are high in calories and protein, such as nuts, beans, yogurt, and cheese. Shopping Do not buy foods labeled as diet, low-calorie, or low-fat. If you are able to eat dairy products: Avoid low-fat or skim milk. Buy dairy products that have at least 2% fat. Buy nutritional supplement drinks. Buy grains and prepared foods labeled as enriched or fortified. Consider buying low-sodium, pre-made foods to conserve energy for eating. Cooking Add dry milk or protein powder to smoothies. Cook with healthy fats, such as olive oil, canola oil, sunflower oil, and grapeseed oil. Add oil, butter, cream cheese, or nut butters to foods to increase fat and calories. To make foods easier to chew and swallow: Cook vegetables, pasta, and rice until soft. Cut or grind meat into very small pieces. Dip breads in liquid. Meal planning  Eat when you feel hungry. Eat 5-6 small meals throughout the day. Drink 6-8 glasses of water  each day. Do not drink liquids with meals. Drink liquids at the end of the meal to avoid feeling full too quickly. Eat a variety of fruits and vegetables every day. Ask for assistance from family or friends with planning and preparing meals as needed. Avoid foods that cause you to feel bloated, such as carbonated drinks, fried foods, beans, broccoli, cabbage, and apples. For older adults, ask your local agency on aging whether you are eligible for meal assistance programs, such as Meals on Wheels. Lifestyle  Do not smoke. Eat slowly. Take small bites and chew food well before swallowing. Do not overeat. This may make it more difficult to breathe after eating. Sit up while eating. If needed, continue to use supplemental oxygen while eating. Rest or relax for 30 minutes before and after eating. Monitor your weight as told by your health care provider. Exercise as told by your health care provider. What foods should I eat? Fruits All fresh, dried, canned, or frozen fruits that do not cause gas. Vegetables All fresh, canned (no salt added), or frozen vegetables that do not cause gas. Grains Whole-grain bread. Enriched whole-grain pasta. Fortified whole-grain cereals. Fortified rice. Quinoa. Meats and other proteins Lean meat. Poultry. Fish. Dried beans. Unsalted nuts. Tofu. Eggs. Nut butters. Dairy Whole or 2% milk. Cheese. Yogurt. Fats and oils Olive oil. Canola oil. Butter. Margarine. Beverages Water. Vegetable juice (no salt added). Decaffeinated coffee. Decaffeinated or herbal tea. Seasonings and condiments Fresh or dried herbs. Low-salt or salt-free seasonings. Low-sodium soy sauce. The items listed above may not be a complete list of foods   and beverages you can eat. Contact a dietitian for more information. What foods should I avoid? Fruits Fruits that cause gas, such as apples or melon. Vegetables Vegetables that cause gas, such as broccoli, Brussels sprouts, cabbage,  cauliflower, and onions. Canned vegetables with added salt. Meats and other proteins Fried meat. Salt-cured meat. Processed meat. Dairy Fat-free or low-fat milk, yogurt, or cheese. Processed cheese. Beverages Carbonated drinks. Caffeinated drinks, such as coffee, tea, and soft drinks. Juice. Alcohol. Vegetable juice with added salt. Seasonings and condiments Salt. Seasoning mixes with salt. Soy sauce. Pickles. Other foods Clear soup or broth. Fried foods. Prepared frozen meals. The items listed above may not be a complete list of foods and beverages you should avoid. Contact a dietitian for more information. Summary COPD symptoms can make it difficult to eat enough to maintain a healthy weight. A COPD eating plan can help you maintain your body weight and keep your lungs as healthy as possible. Eat a diet that is high in calories, protein, and other nutrients. Read labels to make sure that you are getting the right nutrients. Cook foods to make them easier to chew and swallow. Eat 5-6 small meals throughout the day, and avoid foods that cause gas or make you feel bloated. This information is not intended to replace advice given to you by your health care provider. Make sure you discuss any questions you have with your health care provider. Document Revised: 06/27/2020 Document Reviewed: 06/27/2020 Elsevier Patient Education  2023 Elsevier Inc.  

## 2022-02-18 ENCOUNTER — Ambulatory Visit: Payer: Medicare Other | Admitting: Nurse Practitioner

## 2022-02-21 ENCOUNTER — Ambulatory Visit (INDEPENDENT_AMBULATORY_CARE_PROVIDER_SITE_OTHER): Payer: Medicare Other | Admitting: Nurse Practitioner

## 2022-02-21 ENCOUNTER — Encounter: Payer: Self-pay | Admitting: Nurse Practitioner

## 2022-02-21 VITALS — BP 132/78 | HR 62 | Temp 98.4°F | Ht 70.0 in | Wt 220.2 lb

## 2022-02-21 DIAGNOSIS — E6609 Other obesity due to excess calories: Secondary | ICD-10-CM

## 2022-02-21 DIAGNOSIS — J123 Human metapneumovirus pneumonia: Secondary | ICD-10-CM

## 2022-02-21 DIAGNOSIS — R7309 Other abnormal glucose: Secondary | ICD-10-CM | POA: Diagnosis not present

## 2022-02-21 DIAGNOSIS — J432 Centrilobular emphysema: Secondary | ICD-10-CM

## 2022-02-21 DIAGNOSIS — F17210 Nicotine dependence, cigarettes, uncomplicated: Secondary | ICD-10-CM

## 2022-02-21 DIAGNOSIS — E7800 Pure hypercholesterolemia, unspecified: Secondary | ICD-10-CM | POA: Diagnosis not present

## 2022-02-21 DIAGNOSIS — I10 Essential (primary) hypertension: Secondary | ICD-10-CM

## 2022-02-21 DIAGNOSIS — I700 Atherosclerosis of aorta: Secondary | ICD-10-CM

## 2022-02-21 DIAGNOSIS — Z6831 Body mass index (BMI) 31.0-31.9, adult: Secondary | ICD-10-CM

## 2022-02-21 MED ORDER — ALBUTEROL SULFATE HFA 108 (90 BASE) MCG/ACT IN AERS: 2.0000 | INHALATION_SPRAY | Freq: Four times a day (QID) | RESPIRATORY_TRACT | 4 refills | Status: DC | PRN

## 2022-02-21 MED ORDER — TIOTROPIUM BROMIDE-OLODATEROL 2.5-2.5 MCG/ACT IN AERS: 2.0000 | INHALATION_SPRAY | Freq: Every day | RESPIRATORY_TRACT | 12 refills | Status: DC

## 2022-02-21 NOTE — Assessment & Plan Note (Signed)
Chronic, ongoing with history of poor tolerance to Atorvastatin.  Recheck lipid panel today, have low threshold for starting statin, which discussed with patient.  Discussed trial of Rosuvastatin, which he may tolerate better, if ongoing elevation of LDL.  Will further discuss upon return of labs.

## 2022-02-21 NOTE — Progress Notes (Signed)
BP 132/78   Pulse 62   Temp 98.4 F (36.9 C) (Oral)   Ht _0  (1.778 m)   Wt 220 lb 3.2 oz (99.9 kg)   SpO2 95%   BMI 31.60 kg/m    Subjective:    Patient ID: Hunter Huffman, male    DOB: 1961/03/29, 61 y.o.   MRN: [id number]  HPI: Hunter Huffman is a 61 y.o. male  Chief Complaint  Patient presents with   Emphysema   Hypertension   Colon Cancer Screening    Patient says he is no interested in having any colon screening. Patient declines at today's visit.    COPD Continues on Stiolto daily and Albuterol as needed.  Has quit smoking -- 12/26/21 and has goal to continue.  Pneumonia from April has improved. COPD status: stable Satisfied with current treatment?: yes Oxygen use: no Dyspnea frequency: occasional with activity Cough frequency: occasional Rescue inhaler frequency:  occasional Limitation of activity: no Productive cough: none Last Spirometry: February 2023 Pneumovax: Up To Date Influenza: Up to Date   HYPERTENSION Continues on HCTZ daily. A1c in January was elevated at 5.8%. Hypertension status: stable  Satisfied with current treatment? yes Duration of hypertension: chronic BP monitoring frequency:  not checking BP range:  BP medication side effects:  no Medication compliance: good compliance Previous BP meds: Aspirin: no Recurrent headaches: no Visual changes: no Palpitations: no Dyspnea: no Chest pain: no Lower extremity edema: no Dizzy/lightheaded: no  The 10-year ASCVD risk score (Arnett DK, et al., 2019) is: 16%   Values used to calculate the score:     Age: 84 years     Sex: Male     Is Non-Hispanic African American: No     Diabetic: No     Tobacco smoker: Yes     Systolic Blood Pressure: 537 mmHg     Is BP treated: Yes     HDL Cholesterol: 43 mg/dL     Total Cholesterol: 159 mg/dL  Relevant past medical, surgical, family and social history reviewed and updated as indicated. Interim medical history since our last visit  reviewed. Allergies and medications reviewed and updated.  Review of Systems  Constitutional:  Negative for activity change, appetite change, diaphoresis, fatigue and unexpected weight change.  Respiratory:  Negative for cough, chest tightness, shortness of breath and wheezing.   Cardiovascular:  Negative for chest pain, palpitations and leg swelling.  Gastrointestinal: Negative.   Skin: Negative.   Neurological: Negative.   Psychiatric/Behavioral: Negative.      Per HPI unless specifically indicated above     Objective:    BP 132/78   Pulse 62   Temp 98.4 F (36.9 C) (Oral)   Ht _1  (1.778 m)   Wt 220 lb 3.2 oz (99.9 kg)   SpO2 95%   BMI 31.60 kg/m   Wt Readings from Last 3 Encounters:  02/21/22 220 lb 3.2 oz (99.9 kg)  01/04/22 213 lb 3.2 oz (96.7 kg)  12/26/21 214 lb 4.6 oz (97.2 kg)    Physical Exam Vitals and nursing note reviewed.  Constitutional:      General: He is awake. He is not in acute distress.    Appearance: He is well-developed and well-groomed. He is obese. He is not ill-appearing or toxic-appearing.  HENT:     Head: Normocephalic and atraumatic.     Right Ear: Hearing normal. No drainage.     Left Ear: Hearing normal. No drainage.  Eyes:  General: Lids are normal.        Right eye: No discharge.        Left eye: No discharge.     Conjunctiva/sclera: Conjunctivae normal.     Pupils: Pupils are equal, round, and reactive to light.  Neck:     Thyroid: No thyromegaly.     Vascular: No carotid bruit.  Cardiovascular:     Rate and Rhythm: Normal rate and regular rhythm.     Heart sounds: Normal heart sounds, S1 normal and S2 normal. No murmur heard.    No gallop.  Pulmonary:     Effort: Pulmonary effort is normal. No accessory muscle usage or respiratory distress.     Breath sounds: Normal breath sounds.  Abdominal:     General: Bowel sounds are normal. There is no distension.     Palpations: Abdomen is soft.     Tenderness: There is no  abdominal tenderness.  Musculoskeletal:        General: Normal range of motion.     Cervical back: Normal range of motion and neck supple.     Right lower leg: No edema.     Left lower leg: No edema.  Lymphadenopathy:     Cervical: No cervical adenopathy.  Skin:    General: Skin is warm and dry.     Capillary Refill: Capillary refill takes less than 2 seconds.  Neurological:     Mental Status: He is alert and oriented to person, place, and time.     Deep Tendon Reflexes: Reflexes are normal and symmetric.  Psychiatric:        Mood and Affect: Mood normal.        Speech: Speech normal.        Behavior: Behavior normal. Behavior is cooperative.        Thought Content: Thought content normal.    Results for orders placed or performed in visit on 01/04/22  CBC with Differential/Platelet  Result Value Ref Range   WBC 10.0 3.4 - 10.8 x10E3/uL   RBC 4.96 4.14 - 5.80 x10E6/uL   Hemoglobin 15.9 13.0 - 17.7 g/dL   Hematocrit 47.5 37.5 - 51.0 %   MCV 96 79 - 97 fL   MCH 32.1 26.6 - 33.0 pg   MCHC 33.5 31.5 - 35.7 g/dL   RDW 12.6 11.6 - 15.4 %   Platelets 368 150 - 450 x10E3/uL   Neutrophils 55 Not Estab. %   Lymphs 32 Not Estab. %   Monocytes 9 Not Estab. %   Eos 2 Not Estab. %   Basos 1 Not Estab. %   Neutrophils Absolute 5.4 1.4 - 7.0 x10E3/uL   Lymphocytes Absolute 3.2 (H) 0.7 - 3.1 x10E3/uL   Monocytes Absolute 0.9 0.1 - 0.9 x10E3/uL   EOS (ABSOLUTE) 0.2 0.0 - 0.4 x10E3/uL   Basophils Absolute 0.1 0.0 - 0.2 x10E3/uL   Immature Granulocytes 1 Not Estab. %   Immature Grans (Abs) 0.1 0.0 - 0.1 x10E3/uL  Alpha-1-Antitrypsin Deficiency  Result Value Ref Range   AAT, DNA Analysis Comment    Additional Information: Comment    Electronically Signed by: Comment   Comprehensive metabolic panel  Result Value Ref Range   Glucose 94 70 - 99 mg/dL   BUN 13 8 - 27 mg/dL   Creatinine, Ser 1.23 0.76 - 1.27 mg/dL   eGFR 67 >59 mL/min/1.73   BUN/Creatinine Ratio 11 10 - 24   Sodium 138  134 - 144 mmol/L   Potassium 4.7  3.5 - 5.2 mmol/L   Chloride 100 96 - 106 mmol/L   CO2 26 20 - 29 mmol/L   Calcium 9.5 8.6 - 10.2 mg/dL   Total Protein 6.3 6.0 - 8.5 g/dL   Albumin 4.1 3.8 - 4.8 g/dL   Globulin, Total 2.2 1.5 - 4.5 g/dL   Albumin/Globulin Ratio 1.9 1.2 - 2.2   Bilirubin Total 0.5 0.0 - 1.2 mg/dL   Alkaline Phosphatase 79 44 - 121 IU/L   AST 21 0 - 40 IU/L   ALT 31 0 - 44 IU/L  Lipase  Result Value Ref Range   Lipase 19 13 - 78 U/L  Amylase  Result Value Ref Range   Amylase 55 31 - 110 U/L      Assessment & Plan:   Problem List Items Addressed This Visit       Cardiovascular and Mediastinum   Aortic atherosclerosis (HCC)   Relevant Orders   Comprehensive metabolic panel   Lipid Panel w/o Chol/HDL Ratio   Hypertension    Chronic, ongoing with BP at goal in office today.  Recommend he monitor BP at least a few mornings a week at home and document.  DASH diet at home.  Continue current medication regimen and adjust as needed.  Labs today: CBC, CMP, lipid today.  Refills sent up to date.       Relevant Orders   CBC with Differential/Platelet   Comprehensive metabolic panel     Respiratory   Centrilobular emphysema (HCC) - Primary    Chronic, ongoing.  Spirometry February 2023 FEV1 32% and FEV1/FVC 57%.  Recommend continued cessation of smoking.  Will continue Stiolto daily, educated him on this and importance of use due to progressive nature of disease, plus continue Albuterol as needed -- refills sent.  Lung cancer screening annually, next 11/14/22.  Printed him a coupon off [employer] site for assist with cost.      Relevant Medications   albuterol (VENTOLIN HFA) 108 (90 Base) MCG/ACT inhaler   Tiotropium Bromide-Olodaterol 2.5-2.5 MCG/ACT AERS   Other Relevant Orders   CBC with Differential/Platelet   Pneumonia due to human metapneumovirus (hMPV)    Improved at this time, he refuses repeat imaging.      Relevant Medications   albuterol (VENTOLIN  HFA) 108 (90 Base) MCG/ACT inhaler   Tiotropium Bromide-Olodaterol 2.5-2.5 MCG/ACT AERS   Other Relevant Orders   CBC with Differential/Platelet     Other   Elevated hemoglobin A1c measurement    5.8% in January, family history of diabetes -- recheck today and focus on diet changes.      Relevant Orders   HgB A1c   Hypercholesteremia    Chronic, ongoing with history of poor tolerance to Atorvastatin.  Recheck lipid panel today, have low threshold for starting statin, which discussed with patient.  Discussed trial of Rosuvastatin, which he may tolerate better, if ongoing elevation of LDL.  Will further discuss upon return of labs.        Relevant Orders   Comprehensive metabolic panel   Lipid Panel w/o Chol/HDL Ratio   Nicotine dependence, cigarettes, uncomplicated    Quit in April 2023, recommend continued cessation.  He reports feeling much better without smoking.      Obesity    BMI 31.60.  Recommended eating smaller high protein, low fat meals more frequently and exercising 30 mins a day 5 times a week with a goal of 10-15lb weight loss in the next 3 months. Patient voiced their  understanding and motivation to adhere to these recommendations.        Follow up plan: Return in about 6 months (around 08/23/2022) for COPD, HTN, HLD, A1c check.

## 2022-02-21 NOTE — Assessment & Plan Note (Signed)
Chronic, ongoing.  Spirometry February 2023 FEV1 32% and FEV1/FVC 57%.  Recommend continued cessation of smoking.  Will continue Stiolto daily, educated him on this and importance of use due to progressive nature of disease, plus continue Albuterol as needed -- refills sent.  Lung cancer screening annually, next 11/14/22.  Printed him a coupon off [employer] site for assist with cost.

## 2022-02-21 NOTE — Assessment & Plan Note (Signed)
Improved at this time, he refuses repeat imaging.

## 2022-02-21 NOTE — Assessment & Plan Note (Signed)
Quit in April 2023, recommend continued cessation.  He reports feeling much better without smoking.

## 2022-02-21 NOTE — Assessment & Plan Note (Signed)
BMI 31.60.  Recommended eating smaller high protein, low fat meals more frequently and exercising 30 mins a day 5 times a week with a goal of 10-15lb weight loss in the next 3 months. Patient voiced their understanding and motivation to adhere to these recommendations.

## 2022-02-21 NOTE — Assessment & Plan Note (Signed)
Chronic, ongoing with BP at goal in office today.  Recommend he monitor BP at least a few mornings a week at home and document.  DASH diet at home.  Continue current medication regimen and adjust as needed.  Labs today: CBC, CMP, lipid today.  Refills sent up to date.

## 2022-02-21 NOTE — Assessment & Plan Note (Signed)
5.8% in January, family history of diabetes -- recheck today and focus on diet changes.

## 2022-02-22 LAB — HEMOGLOBIN A1C
Est. average glucose Bld gHb Est-mCnc: 123 mg/dL
Hgb A1c MFr Bld: 5.9 % — ABNORMAL HIGH (ref 4.8–5.6)

## 2022-02-22 LAB — LIPID PANEL W/O CHOL/HDL RATIO
Cholesterol, Total: 170 mg/dL (ref 100–199)
HDL: 44 mg/dL (ref >39)
LDL Chol Calc (NIH): 96 mg/dL (ref 0–99)
Triglycerides: 171 mg/dL — ABNORMAL HIGH (ref 0–149)
VLDL Cholesterol Cal: 30 mg/dL (ref 5–40)

## 2022-02-22 LAB — COMPREHENSIVE METABOLIC PANEL
ALT: 25 IU/L (ref 0–44)
AST: 23 IU/L (ref 0–40)
Albumin/Globulin Ratio: 2.6 — ABNORMAL HIGH (ref 1.2–2.2)
Albumin: 4.7 g/dL (ref 3.8–4.8)
Alkaline Phosphatase: 66 IU/L (ref 44–121)
BUN/Creatinine Ratio: 9 — ABNORMAL LOW (ref 10–24)
BUN: 11 mg/dL (ref 8–27)
Bilirubin Total: 0.5 mg/dL (ref 0.0–1.2)
CO2: 24 mmol/L (ref 20–29)
Calcium: 10 mg/dL (ref 8.6–10.2)
Chloride: 101 mmol/L (ref 96–106)
Creatinine, Ser: 1.25 mg/dL (ref 0.76–1.27)
Globulin, Total: 1.8 g/dL (ref 1.5–4.5)
Glucose: 91 mg/dL (ref 70–99)
Potassium: 4.3 mmol/L (ref 3.5–5.2)
Sodium: 140 mmol/L (ref 134–144)
Total Protein: 6.5 g/dL (ref 6.0–8.5)
eGFR: 66 mL/min/{1.73_m2} (ref >59)

## 2022-02-22 LAB — CBC WITH DIFFERENTIAL/PLATELET
Basophils Absolute: 0.1 10*3/uL (ref 0.0–0.2)
Basos: 1 %
EOS (ABSOLUTE): 0.2 10*3/uL (ref 0.0–0.4)
Eos: 3 %
Hematocrit: 48.5 % (ref 37.5–51.0)
Hemoglobin: 16.4 g/dL (ref 13.0–17.7)
Immature Grans (Abs): 0 10*3/uL (ref 0.0–0.1)
Immature Granulocytes: 0 %
Lymphocytes Absolute: 2.6 10*3/uL (ref 0.7–3.1)
Lymphs: 39 %
MCH: 32.3 pg (ref 26.6–33.0)
MCHC: 33.8 g/dL (ref 31.5–35.7)
MCV: 96 fL (ref 79–97)
Monocytes Absolute: 0.6 10*3/uL (ref 0.1–0.9)
Monocytes: 9 %
Neutrophils Absolute: 3.2 10*3/uL (ref 1.4–7.0)
Neutrophils: 48 %
Platelets: 259 10*3/uL (ref 150–450)
RBC: 5.07 x10E6/uL (ref 4.14–5.80)
RDW: 13.2 % (ref 11.6–15.4)
WBC: 6.6 10*3/uL (ref 3.4–10.8)

## 2022-02-25 ENCOUNTER — Other Ambulatory Visit: Payer: Self-pay | Admitting: Nurse Practitioner

## 2022-02-25 MED ORDER — ALBUTEROL SULFATE HFA 108 (90 BASE) MCG/ACT IN AERS: 2.0000 | INHALATION_SPRAY | Freq: Four times a day (QID) | RESPIRATORY_TRACT | 3 refills | Status: DC | PRN

## 2022-02-28 ENCOUNTER — Telehealth: Payer: Self-pay | Admitting: Nurse Practitioner

## 2022-02-28 MED ORDER — ALBUTEROL SULFATE HFA 108 (90 BASE) MCG/ACT IN AERS: 2.0000 | INHALATION_SPRAY | Freq: Four times a day (QID) | RESPIRATORY_TRACT | 4 refills | Status: DC | PRN

## 2022-02-28 NOTE — Telephone Encounter (Signed)
albuterol (VENTOLIN HFA) 108 (90 Base) MCG/ACT inhaler  [facility] [place], [facility]  [address] [place] [address]  Phone: [phone] Fax: [phone]  [facility] has called stating that this med again for the 2nd time is not covered by pt ins. Pls call [phone] Ref [id number] an alternative suggested was Generic for ProVentil or other suggest pls call and use Ref #

## 2022-03-18 DIAGNOSIS — M47819 Spondylosis without myelopathy or radiculopathy, site unspecified: Secondary | ICD-10-CM | POA: Diagnosis not present

## 2022-06-18 DIAGNOSIS — M47819 Spondylosis without myelopathy or radiculopathy, site unspecified: Secondary | ICD-10-CM | POA: Diagnosis not present

## 2022-06-18 DIAGNOSIS — Z79899 Other long term (current) drug therapy: Secondary | ICD-10-CM | POA: Diagnosis not present

## 2022-08-20 NOTE — Patient Instructions (Signed)
Eating Plan for Chronic Obstructive Pulmonary Disease Chronic obstructive pulmonary disease (COPD) causes symptoms such as shortness of breath, coughing, and chest discomfort. These symptoms can make it difficult to eat enough to maintain a healthy weight. Generally, people with COPD should eat a diet that is high in calories, protein, and other nutrients to maintain body weight and to keep the lungs as healthy as possible. Depending on the medicines you take and other health conditions you may have, your health care provider may give you additional recommendations on what to eat or avoid. Talk with your health care provider about your goals for body weight, and work with a dietitian to develop an eating plan that is right for you. What are tips for following this plan? Reading food labels  Avoid foods with more than 300 milligrams (mg) of salt (sodium) per serving. Choose foods that contain at least 4 grams (g) of fiber per serving. Try to eat 20-30 g of fiber each day. Choose foods that are high in calories and protein, such as nuts, beans, yogurt, and cheese. Shopping Do not buy foods labeled as diet, low-calorie, or low-fat. If you are able to eat dairy products: Avoid low-fat or skim milk. Buy dairy products that have at least 2% fat. Buy nutritional supplement drinks. Buy grains and prepared foods labeled as enriched or fortified. Consider buying low-sodium, pre-made foods to conserve energy for eating. Cooking Add dry milk or protein powder to smoothies. Cook with healthy fats, such as olive oil, canola oil, sunflower oil, and grapeseed oil. Add oil, butter, cream cheese, or nut butters to foods to increase fat and calories. To make foods easier to chew and swallow: Cook vegetables, pasta, and rice until soft. Cut or grind meat into very small pieces. Dip breads in liquid. Meal planning  Eat when you feel hungry. Eat 5-6 small meals throughout the day. Drink 6-8 glasses of water  each day. Do not drink liquids with meals. Drink liquids at the end of the meal to avoid feeling full too quickly. Eat a variety of fruits and vegetables every day. Ask for assistance from family or friends with planning and preparing meals as needed. Avoid foods that cause you to feel bloated, such as carbonated drinks, fried foods, beans, broccoli, cabbage, and apples. For older adults, ask your local agency on aging whether you are eligible for meal assistance programs, such as Meals on Wheels. Lifestyle  Do not smoke. Eat slowly. Take small bites and chew food well before swallowing. Do not overeat. This may make it more difficult to breathe after eating. Sit up while eating. If needed, continue to use supplemental oxygen while eating. Rest or relax for 30 minutes before and after eating. Monitor your weight as told by your health care provider. Exercise as told by your health care provider. What foods should I eat? Fruits All fresh, dried, canned, or frozen fruits that do not cause gas. Vegetables All fresh, canned (no salt added), or frozen vegetables that do not cause gas. Grains Whole-grain bread. Enriched whole-grain pasta. Fortified whole-grain cereals. Fortified rice. Quinoa. Meats and other proteins Lean meat. Poultry. Fish. Dried beans. Unsalted nuts. Tofu. Eggs. Nut butters. Dairy Whole or 2% milk. Cheese. Yogurt. Fats and oils Olive oil. Canola oil. Butter. Margarine. Beverages Water. Vegetable juice (no salt added). Decaffeinated coffee. Decaffeinated or herbal tea. Seasonings and condiments Fresh or dried herbs. Low-salt or salt-free seasonings. Low-sodium soy sauce. The items listed above may not be a complete list of foods   and beverages you can eat. Contact a dietitian for more information. What foods should I avoid? Fruits Fruits that cause gas, such as apples or melon. Vegetables Vegetables that cause gas, such as broccoli, Brussels sprouts, cabbage,  cauliflower, and onions. Canned vegetables with added salt. Meats and other proteins Fried meat. Salt-cured meat. Processed meat. Dairy Fat-free or low-fat milk, yogurt, or cheese. Processed cheese. Beverages Carbonated drinks. Caffeinated drinks, such as coffee, tea, and soft drinks. Juice. Alcohol. Vegetable juice with added salt. Seasonings and condiments Salt. Seasoning mixes with salt. Soy sauce. Pickles. Other foods Clear soup or broth. Fried foods. Prepared frozen meals. The items listed above may not be a complete list of foods and beverages you should avoid. Contact a dietitian for more information. Summary COPD symptoms can make it difficult to eat enough to maintain a healthy weight. A COPD eating plan can help you maintain your body weight and keep your lungs as healthy as possible. Eat a diet that is high in calories, protein, and other nutrients. Read labels to make sure that you are getting the right nutrients. Cook foods to make them easier to chew and swallow. Eat 5-6 small meals throughout the day, and avoid foods that cause gas or make you feel bloated. This information is not intended to replace advice given to you by your health care provider. Make sure you discuss any questions you have with your health care provider. Document Revised: 06/27/2020 Document Reviewed: 06/27/2020 Elsevier Patient Education  2023 Elsevier Inc.  

## 2022-08-23 ENCOUNTER — Encounter: Payer: Self-pay | Admitting: Nurse Practitioner

## 2022-08-23 ENCOUNTER — Ambulatory Visit (INDEPENDENT_AMBULATORY_CARE_PROVIDER_SITE_OTHER): Payer: Medicare Other | Admitting: Nurse Practitioner

## 2022-08-23 VITALS — BP 124/77 | HR 60 | Temp 97.9°F | Ht 70.0 in | Wt 216.8 lb

## 2022-08-23 DIAGNOSIS — I10 Essential (primary) hypertension: Secondary | ICD-10-CM | POA: Diagnosis not present

## 2022-08-23 DIAGNOSIS — J432 Centrilobular emphysema: Secondary | ICD-10-CM

## 2022-08-23 DIAGNOSIS — F17210 Nicotine dependence, cigarettes, uncomplicated: Secondary | ICD-10-CM | POA: Diagnosis not present

## 2022-08-23 DIAGNOSIS — I700 Atherosclerosis of aorta: Secondary | ICD-10-CM | POA: Diagnosis not present

## 2022-08-23 DIAGNOSIS — Z833 Family history of diabetes mellitus: Secondary | ICD-10-CM

## 2022-08-23 DIAGNOSIS — R7309 Other abnormal glucose: Secondary | ICD-10-CM | POA: Diagnosis not present

## 2022-08-23 DIAGNOSIS — E6609 Other obesity due to excess calories: Secondary | ICD-10-CM

## 2022-08-23 DIAGNOSIS — Z6831 Body mass index (BMI) 31.0-31.9, adult: Secondary | ICD-10-CM

## 2022-08-23 DIAGNOSIS — E7800 Pure hypercholesterolemia, unspecified: Secondary | ICD-10-CM

## 2022-08-23 DIAGNOSIS — M1990 Unspecified osteoarthritis, unspecified site: Secondary | ICD-10-CM

## 2022-08-23 MED ORDER — AMOXICILLIN-POT CLAVULANATE 875-125 MG PO TABS: 1.0000 | ORAL_TABLET | Freq: Two times a day (BID) | ORAL | 0 refills | Status: AC

## 2022-08-23 NOTE — Assessment & Plan Note (Signed)
5.9% in June, family history of diabetes -- recheck today and focus on diet changes.

## 2022-08-23 NOTE — Assessment & Plan Note (Signed)
Chronic, ongoing with history of poor tolerance to Atorvastatin.  Recheck lipid panel today, have low threshold for starting statin, which discussed with patient.  Discussed trial of Rosuvastatin, which he may tolerate better, if ongoing elevation of LDL.  He refuses at this time.

## 2022-08-23 NOTE — Assessment & Plan Note (Signed)
Chronic, ongoing.  Spirometry February 2023 FEV1 32% and FEV1/FVC 57%.  Recommend continued cessation of smoking.  Will continue Stiolto daily, educated him on this and importance of use due to progressive nature of disease, plus continue Albuterol as needed.  Lung cancer screening annually, next 11/14/22.

## 2022-08-23 NOTE — Assessment & Plan Note (Signed)
BMI 31.11.  Recommended eating smaller high protein, low fat meals more frequently and exercising 30 mins a day 5 times a week with a goal of 10-15lb weight loss in the next 3 months. Patient voiced their understanding and motivation to adhere to these recommendations.

## 2022-08-23 NOTE — Assessment & Plan Note (Signed)
Chronic, stable.  BP at goal in office.  Recommend he monitor BP at least a few mornings a week at home and document.  DASH diet at home.  Continue current medication regimen and adjust as needed.  Labs today: CMP and lipid today.  Refills up to date.

## 2022-08-23 NOTE — Assessment & Plan Note (Signed)
I have recommended complete cessation of tobacco use. I have discussed various options available for assistance with tobacco cessation including over the counter methods (Nicotine gum, patch and lozenges). We also discussed prescription options (Chantix, Nicotine Inhaler / Nasal Spray). The patient is not interested in pursuing any prescription tobacco cessation options at this time.  

## 2022-08-23 NOTE — Progress Notes (Signed)
BP 124/77   Pulse 60   Temp 97.9 F (36.6 C) (Oral)   Ht _0  (1.778 m)   Wt 216 lb 12.8 oz (98.3 kg)   SpO2 98%   BMI 31.11 kg/m    Subjective:    Patient ID: Hunter Huffman, male    DOB: Apr 29, 1961, 61 y.o.   MRN: [id number]  HPI: Hunter Huffman is a 61 y.o. male  Chief Complaint  Patient presents with   COPD   Hypertension   Hyperlipidemia   Diabetes   HYPERTENSION/HYPERLIPIDEMIA Continues on HCTZ daily. A1c in June was elevated at 5.9%.  History of poor reaction with Atorvastatin.  He continues to refuse statin therapy, have had at length discussions.   Hypertension status: stable  Satisfied with current treatment? yes Duration of hypertension: chronic BP monitoring frequency:  not checking BP range:  BP medication side effects:  no Medication compliance: good compliance Previous BP meds: Aspirin: no Recurrent headaches: no Visual changes: no Palpitations: no Dyspnea: no Chest pain: no Lower extremity edema: no Dizzy/lightheaded: no  The 10-year ASCVD risk score (Arnett DK, et al., 2019) is: 15.8%   Values used to calculate the score:     Age: 60 years     Sex: Male     Is Non-Hispanic African American: No     Diabetic: No     Tobacco smoker: Yes     Systolic Blood Pressure: 967 mmHg     Is BP treated: Yes     HDL Cholesterol: 44 mg/dL     Total Cholesterol: 170 mg/dL  COPD Continues on Stiolto daily and Albuterol as needed.  Never has to use Albuterol -- sometimes only takes one puff Stiolto.  Has had a cough for two weeks, started out as a "head cold".  Having some fatigue with this and aching joints.  Is productive cough in morning -- cough is all day.  Taking Tamiflu and Robitussin.  Does not feel like when he had pneumonia.  Continues to smoke, smoking about 1/2 PPD to 2-3 cigarettes a day.  Has been smoking since teen years.  Last CT lung noted moderate centrilobular emphysema and aortic atherosclerosis. COPD status: stable Satisfied with  current treatment?: yes Oxygen use: no Dyspnea frequency: occasional with activity Cough frequency: occasional Rescue inhaler frequency:  occasional Limitation of activity: no Productive cough: yes Last Spirometry: 10/17/21 Pneumovax: Up To Date Influenza: Up to Date   Relevant past medical, surgical, family and social history reviewed and updated as indicated. Interim medical history since our last visit reviewed. Allergies and medications reviewed and updated.  Review of Systems  Constitutional:  Negative for activity change, appetite change, diaphoresis, fatigue and unexpected weight change.  Respiratory:  Negative for cough, chest tightness, shortness of breath and wheezing.   Cardiovascular:  Negative for chest pain, palpitations and leg swelling.  Gastrointestinal: Negative.   Skin: Negative.   Neurological: Negative.   Psychiatric/Behavioral: Negative.      Per HPI unless specifically indicated above     Objective:    BP 124/77   Pulse 60   Temp 97.9 F (36.6 C) (Oral)   Ht _1  (1.778 m)   Wt 216 lb 12.8 oz (98.3 kg)   SpO2 98%   BMI 31.11 kg/m   Wt Readings from Last 3 Encounters:  08/23/22 216 lb 12.8 oz (98.3 kg)  02/21/22 220 lb 3.2 oz (99.9 kg)  01/04/22 213 lb 3.2 oz (96.7 kg)    Physical  Exam Vitals and nursing note reviewed.  Constitutional:      General: He is awake. He is not in acute distress.    Appearance: He is well-developed and well-groomed. He is obese. He is not ill-appearing or toxic-appearing.  HENT:     Head: Normocephalic and atraumatic.     Right Ear: Hearing normal. No drainage.     Left Ear: Hearing normal. No drainage.  Eyes:     General: Lids are normal.        Right eye: No discharge.        Left eye: No discharge.     Conjunctiva/sclera: Conjunctivae normal.     Pupils: Pupils are equal, round, and reactive to light.  Neck:     Thyroid: No thyromegaly.     Vascular: No carotid bruit.  Cardiovascular:     Rate and  Rhythm: Normal rate and regular rhythm.     Heart sounds: Normal heart sounds, S1 normal and S2 normal. No murmur heard.    No gallop.  Pulmonary:     Effort: Pulmonary effort is normal. No accessory muscle usage or respiratory distress.     Breath sounds: Normal breath sounds.  Abdominal:     General: Bowel sounds are normal. There is no distension.     Palpations: Abdomen is soft.     Tenderness: There is no abdominal tenderness.  Musculoskeletal:        General: Normal range of motion.     Cervical back: Normal range of motion and neck supple.     Right lower leg: No edema.     Left lower leg: No edema.  Lymphadenopathy:     Cervical: No cervical adenopathy.  Skin:    General: Skin is warm and dry.     Capillary Refill: Capillary refill takes less than 2 seconds.  Neurological:     Mental Status: He is alert and oriented to person, place, and time.     Deep Tendon Reflexes: Reflexes are normal and symmetric.  Psychiatric:        Mood and Affect: Mood normal.        Speech: Speech normal.        Behavior: Behavior normal. Behavior is cooperative.        Thought Content: Thought content normal.    Results for orders placed or performed in visit on 02/21/22  CBC with Differential/Platelet  Result Value Ref Range   WBC 6.6 3.4 - 10.8 x10E3/uL   RBC 5.07 4.14 - 5.80 x10E6/uL   Hemoglobin 16.4 13.0 - 17.7 g/dL   Hematocrit 48.5 37.5 - 51.0 %   MCV 96 79 - 97 fL   MCH 32.3 26.6 - 33.0 pg   MCHC 33.8 31.5 - 35.7 g/dL   RDW 13.2 11.6 - 15.4 %   Platelets 259 150 - 450 x10E3/uL   Neutrophils 48 Not Estab. %   Lymphs 39 Not Estab. %   Monocytes 9 Not Estab. %   Eos 3 Not Estab. %   Basos 1 Not Estab. %   Neutrophils Absolute 3.2 1.4 - 7.0 x10E3/uL   Lymphocytes Absolute 2.6 0.7 - 3.1 x10E3/uL   Monocytes Absolute 0.6 0.1 - 0.9 x10E3/uL   EOS (ABSOLUTE) 0.2 0.0 - 0.4 x10E3/uL   Basophils Absolute 0.1 0.0 - 0.2 x10E3/uL   Immature Granulocytes 0 Not Estab. %   Immature  Grans (Abs) 0.0 0.0 - 0.1 x10E3/uL  Comprehensive metabolic panel  Result Value Ref Range   Glucose  91 70 - 99 mg/dL   BUN 11 8 - 27 mg/dL   Creatinine, Ser 1.25 0.76 - 1.27 mg/dL   eGFR 66 >59 mL/min/1.73   BUN/Creatinine Ratio 9 (L) 10 - 24   Sodium 140 134 - 144 mmol/L   Potassium 4.3 3.5 - 5.2 mmol/L   Chloride 101 96 - 106 mmol/L   CO2 24 20 - 29 mmol/L   Calcium 10.0 8.6 - 10.2 mg/dL   Total Protein 6.5 6.0 - 8.5 g/dL   Albumin 4.7 3.8 - 4.8 g/dL   Globulin, Total 1.8 1.5 - 4.5 g/dL   Albumin/Globulin Ratio 2.6 (H) 1.2 - 2.2   Bilirubin Total 0.5 0.0 - 1.2 mg/dL   Alkaline Phosphatase 66 44 - 121 IU/L   AST 23 0 - 40 IU/L   ALT 25 0 - 44 IU/L  Lipid Panel w/o Chol/HDL Ratio  Result Value Ref Range   Cholesterol, Total 170 100 - 199 mg/dL   Triglycerides 171 (H) 0 - 149 mg/dL   HDL 44 >39 mg/dL   VLDL Cholesterol Cal 30 5 - 40 mg/dL   LDL Chol Calc (NIH) 96 0 - 99 mg/dL  HgB A1c  Result Value Ref Range   Hgb A1c MFr Bld 5.9 (H) 4.8 - 5.6 %   Est. average glucose Bld gHb Est-mCnc 123 mg/dL      Assessment & Plan:   Problem List Items Addressed This Visit       Cardiovascular and Mediastinum   Aortic atherosclerosis (HCC)    Chronic.  Noted on CT imaging 11/13/21.  Recommend complete cessation of smoking and addition of statin in future for cholesterol control and prevention -- he refuses this.      Relevant Orders   Comprehensive metabolic panel   Lipid Panel w/o Chol/HDL Ratio   Hypertension    Chronic, stable.  BP at goal in office.  Recommend he monitor BP at least a few mornings a week at home and document.  DASH diet at home.  Continue current medication regimen and adjust as needed.  Labs today: CMP and lipid today.  Refills up to date.       Relevant Orders   Comprehensive metabolic panel     Respiratory   Centrilobular emphysema ([place]) - Primary    Chronic, ongoing.  Spirometry February 2023 FEV1 32% and FEV1/FVC 57%.  Recommend continued cessation  of smoking.  Will continue Stiolto daily, educated him on this and importance of use due to progressive nature of disease, plus continue Albuterol as needed.  Lung cancer screening annually, next 11/14/22.          Other   Elevated hemoglobin A1c measurement    5.9% in June, family history of diabetes -- recheck today and focus on diet changes.      Relevant Orders   HgB A1c   Comprehensive metabolic panel   Family history of diabetes mellitus (DM)    Check A1c today and continue to monitor closely.      Relevant Orders   HgB A1c   Hypercholesteremia    Chronic, ongoing with history of poor tolerance to Atorvastatin.  Recheck lipid panel today, have low threshold for starting statin, which discussed with patient.  Discussed trial of Rosuvastatin, which he may tolerate better, if ongoing elevation of LDL.  He refuses at this time.      Relevant Orders   Comprehensive metabolic panel   Lipid Panel w/o Chol/HDL Ratio   Nicotine dependence, cigarettes,  uncomplicated    I have recommended complete cessation of tobacco use. I have discussed various options available for assistance with tobacco cessation including over the counter methods (Nicotine gum, patch and lozenges). We also discussed prescription options (Chantix, Nicotine Inhaler / Nasal Spray). The patient is not interested in pursuing any prescription tobacco cessation options at this time.       Obesity    BMI 31.11.  Recommended eating smaller high protein, low fat meals more frequently and exercising 30 mins a day 5 times a week with a goal of 10-15lb weight loss in the next 3 months. Patient voiced their understanding and motivation to adhere to these recommendations.        Follow up plan: Return in about 6 months (around 02/22/2023) for HTN/HLD, COPD.

## 2022-08-23 NOTE — Assessment & Plan Note (Signed)
Check A1c today and continue to monitor closely.

## 2022-08-23 NOTE — Assessment & Plan Note (Signed)
Chronic.  Noted on CT imaging 11/13/21.  Recommend complete cessation of smoking and addition of statin in future for cholesterol control and prevention -- he refuses this.

## 2022-08-24 LAB — COMPREHENSIVE METABOLIC PANEL
ALT: 25 IU/L (ref 0–44)
AST: 20 IU/L (ref 0–40)
Albumin/Globulin Ratio: 2.2 (ref 1.2–2.2)
Albumin: 4.2 g/dL (ref 3.9–4.9)
Alkaline Phosphatase: 82 IU/L (ref 44–121)
BUN/Creatinine Ratio: 11 (ref 10–24)
BUN: 13 mg/dL (ref 8–27)
Bilirubin Total: 0.5 mg/dL (ref 0.0–1.2)
CO2: 26 mmol/L (ref 20–29)
Calcium: 9.5 mg/dL (ref 8.6–10.2)
Chloride: 101 mmol/L (ref 96–106)
Creatinine, Ser: 1.14 mg/dL (ref 0.76–1.27)
Globulin, Total: 1.9 g/dL (ref 1.5–4.5)
Glucose: 82 mg/dL (ref 70–99)
Potassium: 4.4 mmol/L (ref 3.5–5.2)
Sodium: 139 mmol/L (ref 134–144)
Total Protein: 6.1 g/dL (ref 6.0–8.5)
eGFR: 73 mL/min/{1.73_m2} (ref >59)

## 2022-08-24 LAB — LIPID PANEL W/O CHOL/HDL RATIO
Cholesterol, Total: 156 mg/dL (ref 100–199)
HDL: 36 mg/dL — ABNORMAL LOW (ref >39)
LDL Chol Calc (NIH): 93 mg/dL (ref 0–99)
Triglycerides: 155 mg/dL — ABNORMAL HIGH (ref 0–149)
VLDL Cholesterol Cal: 27 mg/dL (ref 5–40)

## 2022-08-24 LAB — HEMOGLOBIN A1C
Est. average glucose Bld gHb Est-mCnc: 126 mg/dL
Hgb A1c MFr Bld: 6 % — ABNORMAL HIGH (ref 4.8–5.6)

## 2022-08-25 NOTE — Progress Notes (Signed)
Contacted via MyChart   Good evening Hunter Huffman, your labs have returned: - Kidney function, creatinine and eGFR, remains normal, as is liver function, AST and ALT.  - Cholesterol labs remain above goal for prevention of stroke or heart attack, I continue to recommend medications to help lower these. - The A1C is the diabetes testing we talked about, this looks at your blood sugars over the past 3 months and turns the average into a number.  Your number is 6%, meaning you are prediabetic.  Any number 5.7 to 6.4 is considered prediabetes and any number 6.5 or greater is considered diabetes.   I would recommend heavy focus on decreasing foods high in sugar and your intake of things like bread products, pasta, and rice.  The American Diabetes Association online has a large amount of information on diet changes to make.  We will recheck this number in 3-6 months to ensure you are not continuing to trend upwards and move into diabetes.  Any questions? Keep being stellar!!  Thank you for allowing me to participate in your care.  I appreciate you. Kindest regards, Hunter Huffman

## 2022-09-09 ENCOUNTER — Other Ambulatory Visit: Payer: Self-pay | Admitting: Nurse Practitioner

## 2022-09-10 NOTE — Telephone Encounter (Signed)
Requested Prescriptions  Pending Prescriptions Disp Refills   hydrochlorothiazide (HYDRODIURIL) 25 MG tablet [Pharmacy Med Name: hydroCHLOROthiazide 25 MG Oral Tablet] 50 tablet 2    Sig: TAKE ONE-HALF TABLET BY MOUTH  DAILY     Cardiovascular: Diuretics - Thiazide Passed - 09/09/2022  7:25 AM      Passed - Cr in normal range and within 180 days    Creatinine, Ser  Date Value Ref Range Status  08/23/2022 1.14 0.76 - 1.27 mg/dL Final         Passed - K in normal range and within 180 days    Potassium  Date Value Ref Range Status  08/23/2022 4.4 3.5 - 5.2 mmol/L Final         Passed - Na in normal range and within 180 days    Sodium  Date Value Ref Range Status  08/23/2022 139 134 - 144 mmol/L Final         Passed - Last BP in normal range    BP Readings from Last 1 Encounters:  08/23/22 124/77         Passed - Valid encounter within last 6 months    Recent Outpatient Visits           2 weeks ago Centrilobular emphysema ([place])   [place], Jolene T, NP   6 months ago Centrilobular emphysema ([place])   [place], Jolene T, NP   8 months ago Pneumonia due to human metapneumovirus (hMPV)   [place], Jolene T, NP   10 months ago Centrilobular emphysema ([place])   [place], Henrine Screws T, NP   1 year ago Primary hypertension   [place], Barbaraann Faster, NP       Future Appointments             In 5 months [place], Barbaraann Faster, NP [employer]

## 2022-09-17 IMAGING — DX DG CHEST 1V PORT
2 series · 2 of 2 positions shown · non-contrast
Comparison: Radiograph 12/25/2021

CLINICAL DATA: Worsening shortness of breath since yesterday

EXAM:
PORTABLE CHEST 1 VIEW

[chest ap (1 of 2)]
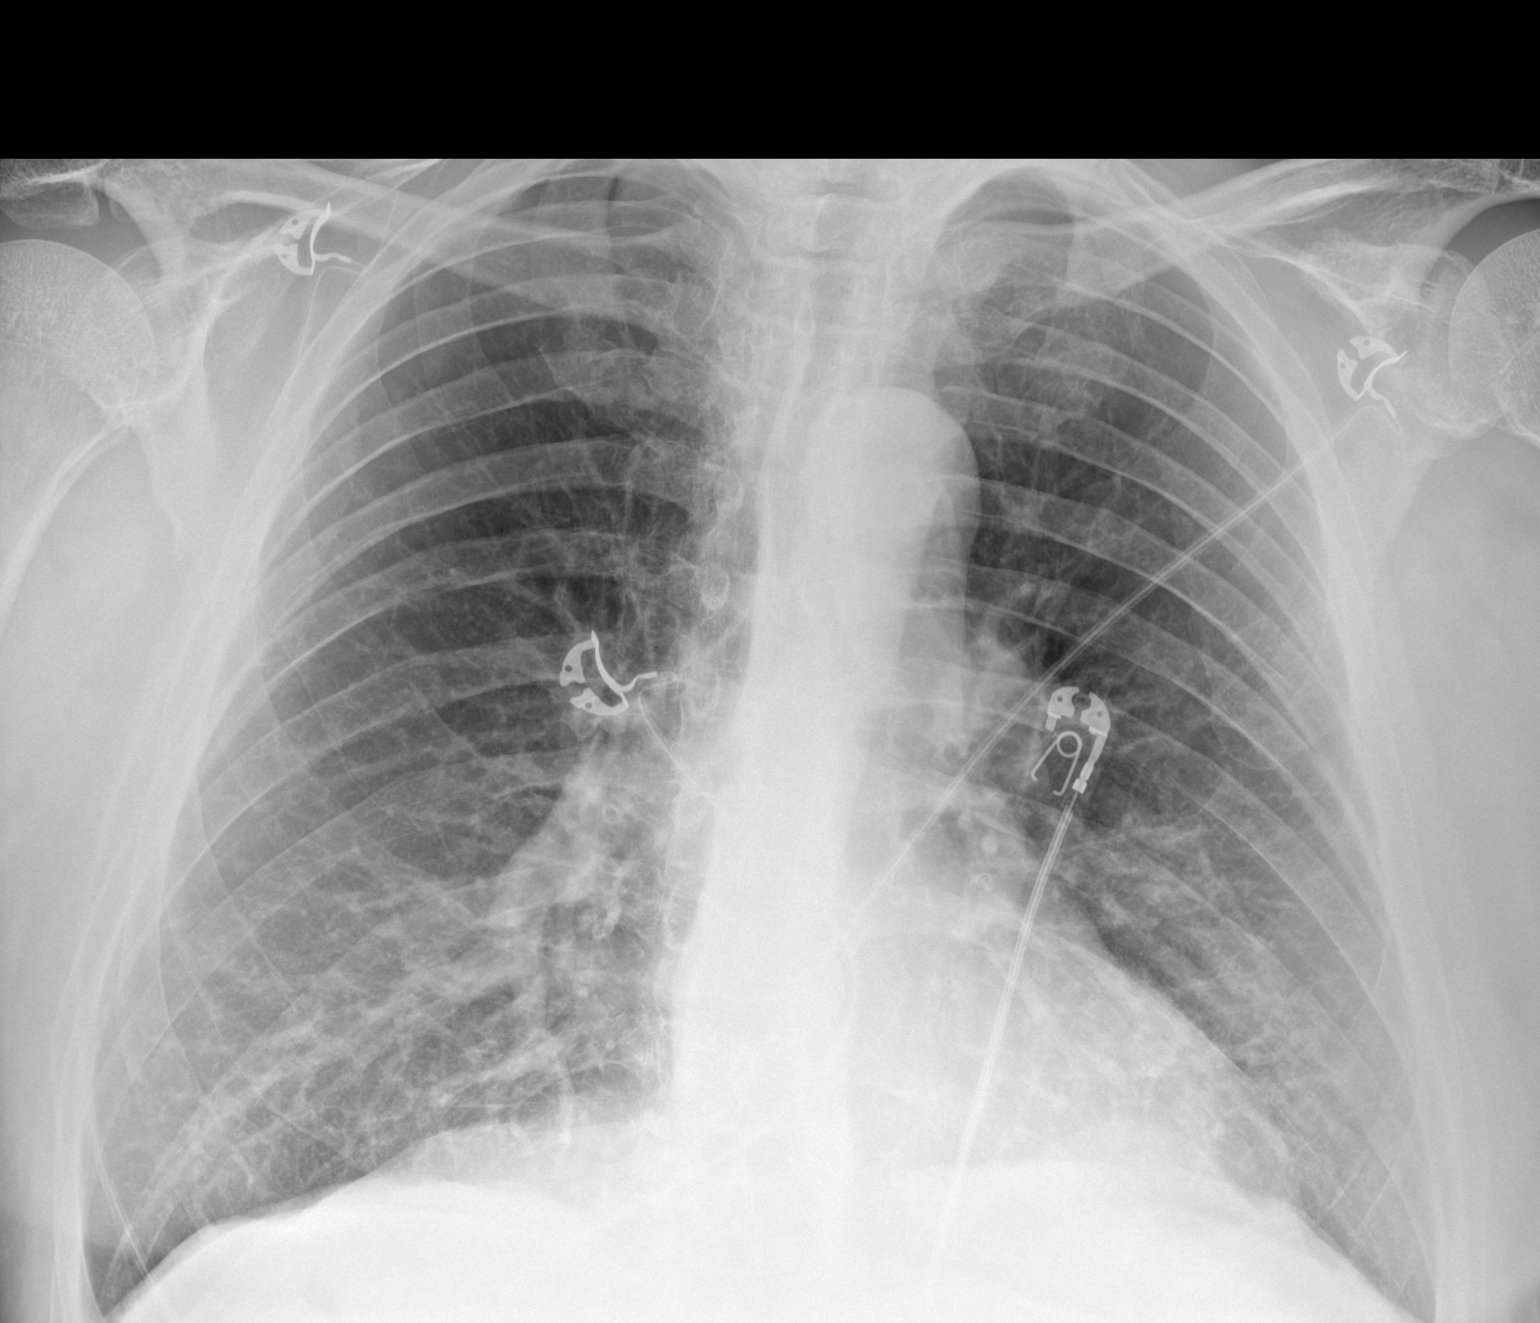

[chest ap (2 of 2)]
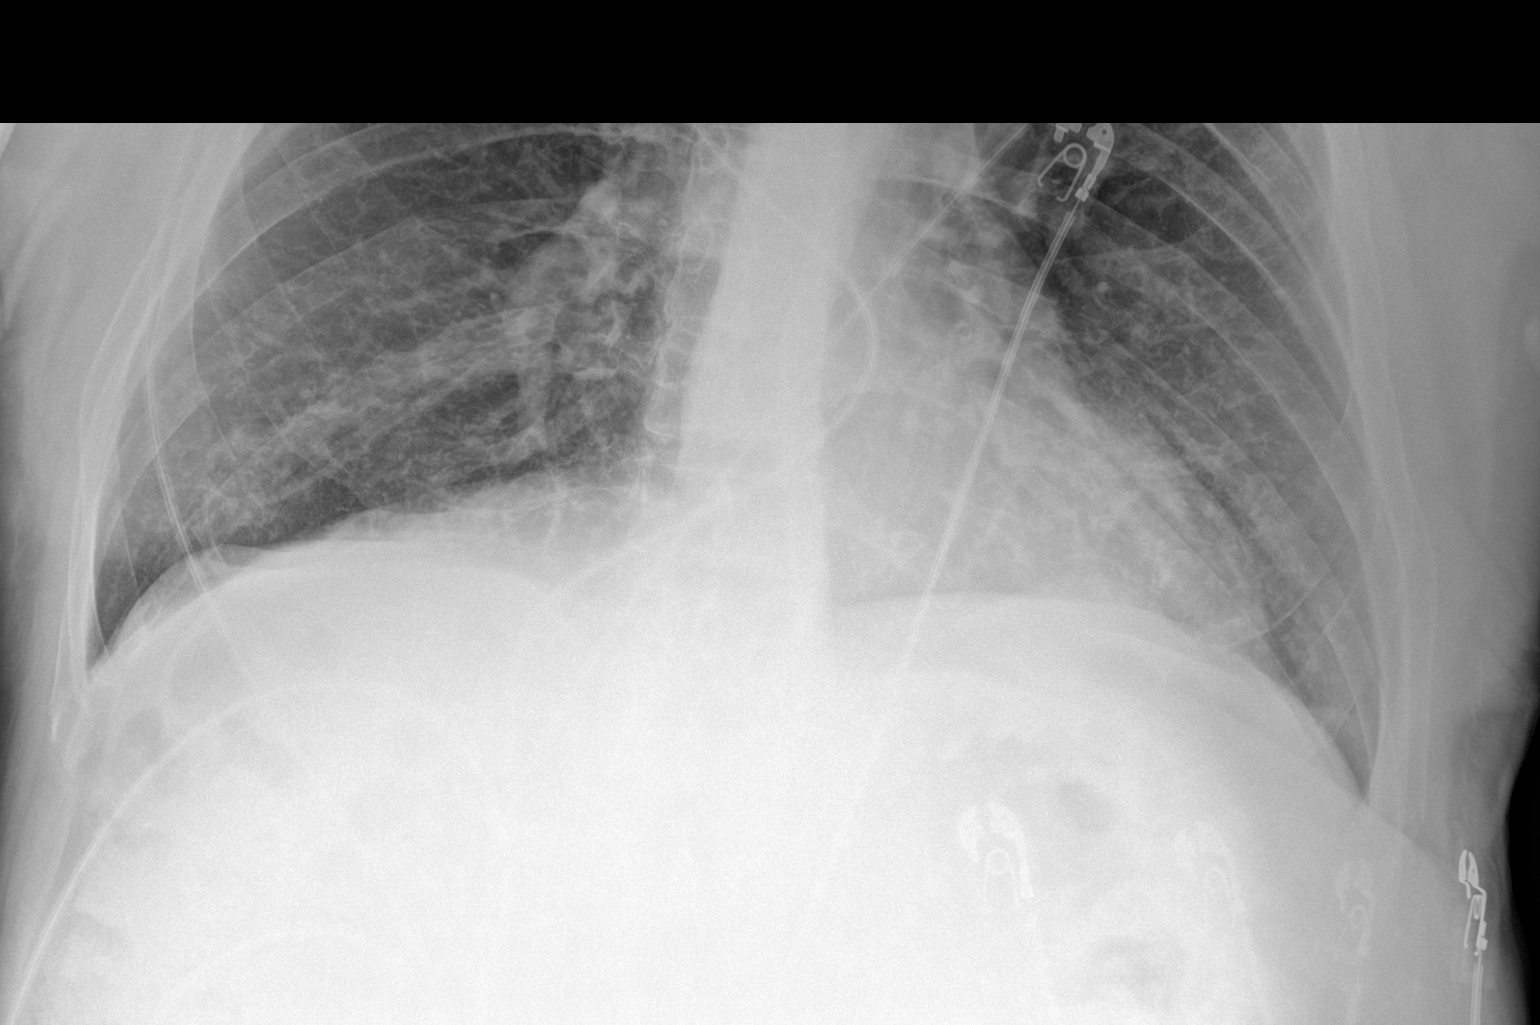

[2 of 2 positions shown; findings below may reference images not displayed]

FINDINGS: Unchanged cardiomediastinal silhouette. Reticulonodular opacities in
the mid to lower lungs best seen on recent chest CT, not
significantly changed radiographically. No new airspace disease. No
large pleural effusion. No visible pneumothorax. No acute osseous
abnormality.
IMPRESSION: Unchanged mid to lower lung reticulonodular opacities consistent
with an infectious/inflammatory process. No new airspace disease.

## 2022-09-26 DIAGNOSIS — M47819 Spondylosis without myelopathy or radiculopathy, site unspecified: Secondary | ICD-10-CM | POA: Diagnosis not present

## 2022-10-14 ENCOUNTER — Telehealth: Payer: Self-pay | Admitting: Nurse Practitioner

## 2022-10-14 NOTE — Telephone Encounter (Signed)
Copied from [place] [id number]. Topic: Medicare AWV >> Oct 14, 2022  2:12 PM Devoria Glassing wrote: Reason for CRM: Left message for patient to schedule Annual Wellness Visit.  Please schedule with Health Nurse Advisor Kirke Shaggy at [facility].Call [place] at [phone]

## 2022-11-14 ENCOUNTER — Ambulatory Visit
Admission: RE | Admit: 2022-11-14 | Discharge: 2022-11-14 | Disposition: A | Discharge status: Home / Self Care | Payer: Medicare Other | Source: Ambulatory Visit | Attending: Acute Care | Admitting: Acute Care

## 2022-11-14 DIAGNOSIS — F17210 Nicotine dependence, cigarettes, uncomplicated: Secondary | ICD-10-CM | POA: Diagnosis not present

## 2022-11-14 DIAGNOSIS — Z87891 Personal history of nicotine dependence: Secondary | ICD-10-CM | POA: Diagnosis not present

## 2022-11-18 ENCOUNTER — Telehealth: Payer: Self-pay | Admitting: Acute Care

## 2022-11-18 NOTE — Telephone Encounter (Signed)
[facility] Call Report

## 2022-11-18 NOTE — Telephone Encounter (Signed)
Received a call report for patient's lung cancer screening CT report. Below is a copy of the impression:   IMPRESSION: 1. New nodular area of architectural distortion in the inferior segment of the lingula considered most likely to reflect an area of evolving post infectious or inflammatory scarring, but categorized as Lung-RADS 4AS on today's examination to facilitate close follow-up. Follow up low-dose chest CT without contrast in 3 months (please use the following order, "CT CHEST LCS NODULE FOLLOW-UP W/O CM") is recommended. 2. Aortic atherosclerosis. 3. Mild diffuse bronchial wall thickening with moderate centrilobular and paraseptal emphysema; imaging findings suggestive of underlying COPD.   These results will be called to the ordering clinician or representative by the Radiologist Assistant, and communication documented in the PACS or [employer].   Aortic Atherosclerosis (ICD10-I70.0) and Emphysema (ICD10-J43.9).  Will route to Judson Roch and the lung nodule pool for follow up.

## 2022-11-19 ENCOUNTER — Other Ambulatory Visit: Payer: Self-pay

## 2022-11-19 ENCOUNTER — Telehealth: Payer: Self-pay | Admitting: Acute Care

## 2022-11-19 DIAGNOSIS — R911 Solitary pulmonary nodule: Secondary | ICD-10-CM

## 2022-11-19 DIAGNOSIS — F17210 Nicotine dependence, cigarettes, uncomplicated: Secondary | ICD-10-CM

## 2022-11-19 DIAGNOSIS — Z87891 Personal history of nicotine dependence: Secondary | ICD-10-CM

## 2022-11-19 NOTE — Telephone Encounter (Signed)
Order placed for 3 months LDCT for nodule follow up Results/plan faxed to PCP

## 2022-11-19 NOTE — Telephone Encounter (Signed)
I have called the patient with the results of his low-dose screening CT. Scan was read as a lung RADS 4A, due to a new nodular area of architectural distortion in the inferior segment of the lingula considered most likely to reflect an area of evolving postinfectious or inflammatory scarring. When asked if the patient has been sick recently he stated not really, but he has had a cough. Plan will be for 13-month follow-up low-dose screening CT.  This will be due in June 2024.  I explained to the patient that he will get a call closer to the time to get this scheduled. We also discussed the notation today of moderate centrilobular and paraseptal emphysema, findings suggestive of underlying COPD.  Patient states that he has never had pulmonary function tests, but states his activities are daily living are not really limited by his respiratory status. There was also notation of aortic atherosclerosis and the great vessels, no coronary artery calcifications.  Patient is not on statin medication.  Patient verbalized understanding of the above and was in agreement with the plan.  Denise patient will need a 62-month low-dose CT follow-up scan. Please fax results to PCP and let them know plan is for a follow-up in 3 months to reevaluate the area of concern.  Thanks so much.

## 2022-12-16 ENCOUNTER — Ambulatory Visit (INDEPENDENT_AMBULATORY_CARE_PROVIDER_SITE_OTHER): Payer: Medicare Other

## 2022-12-16 VITALS — Ht 70.0 in | Wt 216.0 lb

## 2022-12-16 DIAGNOSIS — Z0000 Encounter for general adult medical examination without abnormal findings: Secondary | ICD-10-CM | POA: Diagnosis not present

## 2022-12-16 NOTE — Progress Notes (Signed)
I connected with  Hunter Huffman on 12/16/22 by a audio enabled telemedicine application and verified that I am speaking with the correct person using two identifiers.  Patient Location: Home  Provider Location: Office/Clinic  I discussed the limitations of evaluation and management by telemedicine. The patient expressed understanding and agreed to proceed.  Subjective:   Hunter Huffman is a 62 y.o. male who presents for Medicare Annual/Subsequent preventive examination.  Review of Systems     Cardiac Risk Factors include: advanced age (>69men, >76 women);hypertension;male gender     Objective:    There were no vitals filed for this visit. There is no height or weight on file to calculate BMI.     12/16/2022    2:34 PM 12/26/2021    7:47 AM 12/25/2021    7:59 AM 09/18/2021    8:32 AM  Advanced Directives  Does Patient Have a Medical Advance Directive? No No No No  Would patient like information on creating a medical advance directive? No - Patient declined No - Patient declined No - Patient declined No - Patient declined    Current Medications (verified) Outpatient Encounter Medications as of 12/16/2022  Medication Sig   ENBREL SURECLICK 50 MG/ML injection Inject 50 mg into the skin once a week.    hydrochlorothiazide (HYDRODIURIL) 25 MG tablet TAKE ONE-HALF TABLET BY MOUTH  DAILY   naproxen (NAPROSYN) 500 MG tablet Take 500 mg by mouth 2 (two) times daily.   omeprazole (PRILOSEC) 20 MG capsule Take 20 mg by mouth daily.   Tiotropium Bromide-Olodaterol 2.5-2.5 MCG/ACT AERS Inhale 2 puffs into the lungs daily.   albuterol (PROVENTIL HFA) 108 (90 Base) MCG/ACT inhaler Inhale 2 puffs into the lungs every 6 (six) hours as needed for wheezing or shortness of breath. (Patient not taking: Reported on 12/16/2022)   No facility-administered encounter medications on file as of 12/16/2022.    Allergies (verified) Patient has no known allergies.   History: Past Medical History:   Diagnosis Date   Hypertension    Inflammatory arthritis    Past Surgical History:  Procedure Laterality Date   HEMORRHOID SURGERY     Family History  Problem Relation Age of Onset   Diabetes Mother    Diabetes Father    Hypertension Father    Arthritis Father    Diabetes Brother    Social History   Socioeconomic History   Marital status: Single    Spouse name: Not on file   Number of children: Not on file   Years of education: Not on file   Highest education level: Not on file  Occupational History   Not on file  Tobacco Use   Smoking status: Former    Packs/day: .5    Types: Cigarettes   Smokeless tobacco: Never  Vaping Use   Vaping Use: Never used  Substance and Sexual Activity   Alcohol use: No    Comment: very rarely   Drug use: No   Sexual activity: Yes  Other Topics Concern   Not on file  Social History Narrative   Not on file   Social Determinants of Health   Financial Resource Strain: Low Risk  (12/16/2022)   Overall Financial Resource Strain (CARDIA)    Difficulty of Paying Living Expenses: Not hard at all  Food Insecurity: No Food Insecurity (12/16/2022)   Hunger Vital Sign    Worried About Running Out of Food in the Last Year: Never true    Ran Out of Food in  the Last Year: Never true  Transportation Needs: No Transportation Needs (12/16/2022)   PRAPARE - Administrator, Civil Service (Medical): No    Lack of Transportation (Non-Medical): No  Physical Activity: Insufficiently Active (12/16/2022)   Exercise Vital Sign    Days of Exercise per Week: 3 days    Minutes of Exercise per Session: 30 min  Stress: No Stress Concern Present (12/16/2022)   [employer] of Occupational Health - Occupational Stress Questionnaire    Feeling of Stress : Not at all  Social Connections: Moderately Isolated (12/16/2022)   Social Connection and Isolation Panel [NHANES]    Frequency of Communication with Friends and Family: More than three times a  week    Frequency of Social Gatherings with Friends and Family: More than three times a week    Attends Religious Services: Never    Database administrator or Organizations: No    Attends Engineer, structural: Never    Marital Status: Living with partner    Tobacco Counseling Counseling given: Not Answered   Clinical Intake:  Pre-visit preparation completed: Yes  Pain : No/denies pain     Nutritional Risks: None Diabetes: No  How often do you need to have someone help you when you read instructions, pamphlets, or other written materials from your doctor or pharmacy?: 1 - Never  Diabetic?no  Interpreter Needed?: No  Information entered by :: Kennedy Bucker, LPN   Activities of Daily Living    12/16/2022    2:35 PM 12/26/2021    9:08 PM  In your present state of health, do you have any difficulty performing the following activities:  Hearing? 0   Vision? 0   Difficulty concentrating or making decisions? 0   Walking or climbing stairs? 1   Dressing or bathing? 0   Doing errands, shopping? 0 0  Preparing Food and eating ? N   Using the Toilet? N   In the past six months, have you accidently leaked urine? N   Do you have problems with loss of bowel control? N   Managing your Medications? N   Managing your Finances? N   Housekeeping or managing your Housekeeping? N     Patient Care Team: Marjie Skiff, NP as PCP - General (Nurse Practitioner) Kandyce Rud., MD (Rheumatology)  Indicate any recent Medical Services you may have received from other than [facility] providers in the past year (date may be approximate).     Assessment:   This is a routine wellness examination for Hunter Huffman.  Hearing/Vision screen Hearing Screening - Comments:: No aids Vision Screening - Comments:: readers  Dietary issues and exercise activities discussed: Current Exercise Habits: Home exercise routine, Type of exercise: walking, Time (Minutes): 30, Frequency  (Times/Week): 3, Weekly Exercise (Minutes/Week): 90, Intensity: Mild   Goals Addressed             This Visit's Progress    DIET - EAT MORE FRUITS AND VEGETABLES         Depression Screen    12/16/2022    2:33 PM 08/23/2022    9:35 AM 02/21/2022   10:53 AM 01/04/2022    2:18 PM 10/17/2021    9:11 AM 09/18/2021    8:44 AM 09/05/2021    1:36 PM  PHQ 2/9 Scores  PHQ - 2 Score 0 0 0 0 0 0 0  PHQ- 9 Score 0 0 0 0 0 0 2    Fall Risk  12/16/2022    2:35 PM 08/23/2022    9:35 AM 02/21/2022   10:51 AM 01/04/2022    2:18 PM 10/17/2021    9:11 AM  Fall Risk   Falls in the past year? 0 0 0 0 0  Number falls in past yr: 0 0 0 0 0  Injury with Fall? 0 0 0 0 0  Risk for fall due to : No Fall Risks No Fall Risks No Fall Risks No Fall Risks No Fall Risks  Follow up Falls prevention discussed;Falls evaluation completed Falls evaluation completed Falls evaluation completed Falls evaluation completed Falls evaluation completed    FALL RISK PREVENTION PERTAINING TO THE HOME:  Any stairs in or around the home? Yes  If so, are there any without handrails? No  Home free of loose throw rugs in walkways, pet beds, electrical cords, etc? Yes  Adequate lighting in your home to reduce risk of falls? Yes   ASSISTIVE DEVICES UTILIZED TO PREVENT FALLS:  Life alert? No  Use of a cane, walker or w/c? No  Grab bars in the bathroom? No  Shower chair or bench in shower? Yes  Elevated toilet seat or a handicapped toilet? No   Cognitive Function:        12/16/2022    2:40 PM  6CIT Screen  What Year? 0 points  What month? 0 points  What time? 0 points  Count back from 20 0 points  Months in reverse 0 points  Repeat phrase 0 points  Total Score 0 points    Immunizations Immunization History  Administered Date(s) Administered   Influenza,inj,Quad PF,6+ Mos 07/22/2017, 06/29/2018   Influenza-Unspecified 07/01/2016, 06/21/2020, 08/05/2021, 06/03/2022   PFIZER Comirnaty(Gray Top)Covid-19  Tri-Sucrose Vaccine 10/07/2020   PFIZER(Purple Top)SARS-COV-2 Vaccination 11/09/2019, 12/07/2019   Pneumococcal Polysaccharide-23 09/05/2021   Td 06/20/2008, 10/05/2018    TDAP status: Up to date  Flu Vaccine status: Up to date  Pneumococcal vaccine status: Up to date  Covid-19 vaccine status: Completed vaccines  Qualifies for Shingles Vaccine? Yes   Zostavax completed No   Shingrix Completed?: No.    Education has been provided regarding the importance of this vaccine. Patient has been advised to call insurance company to determine out of pocket expense if they have not yet received this vaccine. Advised may also receive vaccine at local pharmacy or Health Dept. Verbalized acceptance and understanding.  Screening Tests Health Maintenance  Topic Date Due   Zoster Vaccines- Shingrix (1 of 2) Never done   COLONOSCOPY (Pts 45-64yrs Insurance coverage will need to be confirmed)  Never done   COVID-19 Vaccine (4 - 2023-24 season) 05/03/2022   INFLUENZA VACCINE  04/03/2023   Medicare Annual Wellness (AWV)  12/16/2023   DTaP/Tdap/Td (3 - Tdap) 10/05/2028   Hepatitis C Screening  Completed   HIV Screening  Completed   HPV VACCINES  Aged Out    Health Maintenance  Health Maintenance Due  Topic Date Due   Zoster Vaccines- Shingrix (1 of 2) Never done   COLONOSCOPY (Pts 45-42yrs Insurance coverage will need to be confirmed)  Never done   COVID-19 Vaccine (4 - 2023-24 season) 05/03/2022    Declined referral for colonoscopy  Lung Cancer Screening: (Low Dose CT Chest recommended if Age 61-80 years, 30 pack-year currently smoking OR have quit w/in 15years.) does qualify.   Lung Cancer Screening Referral: has one done every year - having another one in 3 months  Additional Screening:  Hepatitis C Screening: does qualify; Completed 10/05/18  Vision Screening: Recommended annual ophthalmology exams for early detection of glaucoma and other disorders of the eye. Is the patient up to  date with their annual eye exam?  No  Who is the provider or what is the name of the office in which the patient attends annual eye exams? No one If pt is not established with a provider, would they like to be referred to a provider to establish care? No .   Dental Screening: Recommended annual dental exams for proper oral hygiene  Community Resource Referral / Chronic Care Management: CRR required this visit?  No   CCM required this visit?  No      Plan:     I have personally reviewed and noted the following in the patient's chart:   Medical and social history Use of alcohol, tobacco or illicit drugs  Current medications and supplements including opioid prescriptions. Patient is not currently taking opioid prescriptions. Functional ability and status Nutritional status Physical activity Advanced directives List of other physicians Hospitalizations, surgeries, and ER visits in previous 12 months Vitals Screenings to include cognitive, depression, and falls Referrals and appointments  In addition, I have reviewed and discussed with patient certain preventive protocols, quality metrics, and best practice recommendations. A written personalized care plan for preventive services as well as general preventive health recommendations were provided to patient.     Hal Hope, LPN   [id number]/61/0960   Nurse Notes: none

## 2022-12-16 NOTE — Patient Instructions (Signed)
Hunter Huffman , Thank you for taking time to come for your Medicare Wellness Visit. I appreciate your ongoing commitment to your health goals. Please review the following plan we discussed and let me know if I can assist you in the future.   These are the goals we discussed:  Goals      DIET - EAT MORE FRUITS AND VEGETABLES     Patient Stated     No goals        This is a list of the screening recommended for you and due dates:  Health Maintenance  Topic Date Due   Zoster (Shingles) Vaccine (1 of 2) Never done   Colon Cancer Screening  Never done   COVID-19 Vaccine (4 - 2023-24 season) 05/03/2022   Flu Shot  04/03/2023   Medicare Annual Wellness Visit  12/16/2023   DTaP/Tdap/Td vaccine (3 - Tdap) 10/05/2028   Hepatitis C Screening: USPSTF Recommendation to screen - Ages 18-79 yo.  Completed   HIV Screening  Completed   HPV Vaccine  Aged Out    Advanced directives: no  Conditions/risks identified: none  Next appointment: Follow up in one year for your annual wellness visit 12/22/23 @ 1:30 pm by phone  Preventive Care 40-64 Years, Male Preventive care refers to lifestyle choices and visits with your health care provider that can promote health and wellness. What does preventive care include? A yearly physical exam. This is also called an annual well check. Dental exams once or twice a year. Routine eye exams. Ask your health care provider how often you should have your eyes checked. Personal lifestyle choices, including: Daily care of your teeth and gums. Regular physical activity. Eating a healthy diet. Avoiding tobacco and drug use. Limiting alcohol use. Practicing safe sex. Taking low-dose aspirin every day starting at age 84. What happens during an annual well check? The services and screenings done by your health care provider during your annual well check will depend on your age, overall health, lifestyle risk factors, and family history of disease. Counseling  Your  health care provider may ask you questions about your: Alcohol use. Tobacco use. Drug use. Emotional well-being. Home and relationship well-being. Sexual activity. Eating habits. Work and work Astronomer. Screening  You may have the following tests or measurements: Height, weight, and BMI. Blood pressure. Lipid and cholesterol levels. These may be checked every 5 years, or more frequently if you are over 51 years old. Skin check. Lung cancer screening. You may have this screening every year starting at age 21 if you have a 30-pack-year history of smoking and currently smoke or have quit within the past 15 years. Fecal occult blood test (FOBT) of the stool. You may have this test every year starting at age 59. Flexible sigmoidoscopy or colonoscopy. You may have a sigmoidoscopy every 5 years or a colonoscopy every 10 years starting at age 40. Prostate cancer screening. Recommendations will vary depending on your family history and other risks. Hepatitis C blood test. Hepatitis B blood test. Sexually transmitted disease (STD) testing. Diabetes screening. This is done by checking your blood sugar (glucose) after you have not eaten for a while (fasting). You may have this done every 1-3 years. Discuss your test results, treatment options, and if necessary, the need for more tests with your health care provider. Vaccines  Your health care provider may recommend certain vaccines, such as: Influenza vaccine. This is recommended every year. Tetanus, diphtheria, and acellular pertussis (Tdap, Td) vaccine. You may need  a Td booster every 10 years. Zoster vaccine. You may need this after age 25. Pneumococcal 13-valent conjugate (PCV13) vaccine. You may need this if you have certain conditions and have not been vaccinated. Pneumococcal polysaccharide (PPSV23) vaccine. You may need one or two doses if you smoke cigarettes or if you have certain conditions. Talk to your health care provider about  which screenings and vaccines you need and how often you need them. This information is not intended to replace advice given to you by your health care provider. Make sure you discuss any questions you have with your health care provider. Document Released: 09/15/2015 Document Revised: 05/08/2016 Document Reviewed: 06/20/2015 Elsevier Interactive Patient Education  2017 [employer].  Fall Prevention in the Home Falls can cause injuries. They can happen to people of all ages. There are many things you can do to make your home safe and to help prevent falls. What can I do on the outside of my home? Regularly fix the edges of walkways and driveways and fix any cracks. Remove anything that might make you trip as you walk through a door, such as a raised step or threshold. Trim any bushes or trees on the path to your home. Use bright outdoor lighting. Clear any walking paths of anything that might make someone trip, such as rocks or tools. Regularly check to see if handrails are loose or broken. Make sure that both sides of any steps have handrails. Any raised decks and porches should have guardrails on the edges. Have any leaves, snow, or ice cleared regularly. Use sand or salt on walking paths during winter. Clean up any spills in your garage right away. This includes oil or grease spills. What can I do in the bathroom? Use night lights. Install grab bars by the toilet and in the tub and shower. Do not use towel bars as grab bars. Use non-skid mats or decals in the tub or shower. If you need to sit down in the shower, use a plastic, non-slip stool. Keep the floor dry. Clean up any water that spills on the floor as soon as it happens. Remove soap buildup in the tub or shower regularly. Attach bath mats securely with double-sided non-slip rug tape. Do not have throw rugs and other things on the floor that can make you trip. What can I do in the bedroom? Use night lights. Make sure that you  have a light by your bed that is easy to reach. Do not use any sheets or blankets that are too big for your bed. They should not hang down onto the floor. Have a firm chair that has side arms. You can use this for support while you get dressed. Do not have throw rugs and other things on the floor that can make you trip. What can I do in the kitchen? Clean up any spills right away. Avoid walking on wet floors. Keep items that you use a lot in easy-to-reach places. If you need to reach something above you, use a strong step stool that has a grab bar. Keep electrical cords out of the way. Do not use floor polish or wax that makes floors slippery. If you must use wax, use non-skid floor wax. Do not have throw rugs and other things on the floor that can make you trip. What can I do with my stairs? Do not leave any items on the stairs. Make sure that there are handrails on both sides of the stairs and use them. Fix  handrails that are broken or loose. Make sure that handrails are as long as the stairways. Check any carpeting to make sure that it is firmly attached to the stairs. Fix any carpet that is loose or worn. Avoid having throw rugs at the top or bottom of the stairs. If you do have throw rugs, attach them to the floor with carpet tape. Make sure that you have a light switch at the top of the stairs and the bottom of the stairs. If you do not have them, ask someone to add them for you. What else can I do to help prevent falls? Wear shoes that: Do not have high heels. Have rubber bottoms. Are comfortable and fit you well. Are closed at the toe. Do not wear sandals. If you use a stepladder: Make sure that it is fully opened. Do not climb a closed stepladder. Make sure that both sides of the stepladder are locked into place. Ask someone to hold it for you, if possible. Clearly mark and make sure that you can see: Any grab bars or handrails. First and last steps. Where the edge of each  step is. Use tools that help you move around (mobility aids) if they are needed. These include: Canes. Walkers. Scooters. Crutches. Turn on the lights when you go into a dark area. Replace any light bulbs as soon as they burn out. Set up your furniture so you have a clear path. Avoid moving your furniture around. If any of your floors are uneven, fix them. If there are any pets around you, be aware of where they are. Review your medicines with your doctor. Some medicines can make you feel dizzy. This can increase your chance of falling. Ask your doctor what other things that you can do to help prevent falls. This information is not intended to replace advice given to you by your health care provider. Make sure you discuss any questions you have with your health care provider. Document Released: 06/15/2009 Document Revised: 01/25/2016 Document Reviewed: 09/23/2014 Elsevier Interactive Patient Education  2017 [employer].

## 2022-12-20 DIAGNOSIS — Z79899 Other long term (current) drug therapy: Secondary | ICD-10-CM | POA: Diagnosis not present

## 2022-12-20 DIAGNOSIS — M47819 Spondylosis without myelopathy or radiculopathy, site unspecified: Secondary | ICD-10-CM | POA: Diagnosis not present

## 2023-02-18 ENCOUNTER — Ambulatory Visit
Admission: RE | Admit: 2023-02-18 | Discharge: 2023-02-18 | Disposition: A | Discharge status: Home / Self Care | Payer: Medicare Other | Source: Ambulatory Visit | Attending: Acute Care | Admitting: Acute Care

## 2023-02-18 DIAGNOSIS — F17210 Nicotine dependence, cigarettes, uncomplicated: Secondary | ICD-10-CM | POA: Insufficient documentation

## 2023-02-18 DIAGNOSIS — Z87891 Personal history of nicotine dependence: Secondary | ICD-10-CM | POA: Diagnosis not present

## 2023-02-18 DIAGNOSIS — R911 Solitary pulmonary nodule: Secondary | ICD-10-CM | POA: Diagnosis not present

## 2023-02-23 DIAGNOSIS — M8580 Other specified disorders of bone density and structure, unspecified site: Secondary | ICD-10-CM | POA: Insufficient documentation

## 2023-02-23 NOTE — Patient Instructions (Signed)
Be Involved in Your Health Care:  Taking Medications When medications are taken as directed, they can greatly improve your health. But if they are not taken as instructed, they may not work. In some cases, not taking them correctly can be harmful. To help ensure your treatment remains effective and safe, understand your medications and how to take them.  Your lab results, notes and after visit summary will be available on My Chart. We strongly encourage you to use this feature. If lab results are abnormal the clinic will contact you with the appropriate steps. If the clinic does not contact you assume the results are satisfactory. You can always see your results on My Chart. If you have questions regarding your condition, please contact the clinic during office hours. You can also ask questions on My Chart.  We at [facility] are grateful that you chose us to provide care. We strive to provide excellent and compassionate care and are always looking for feedback. If you get a survey from the clinic please complete this.   Preventing High Cholesterol Cholesterol is a white, waxy substance similar to fat that the human body needs to help build cells. The liver makes all the cholesterol that a person's body needs. Having high cholesterol (hypercholesterolemia) increases your risk for heart disease and stroke. Extra or excess cholesterol comes from the food that you eat. High cholesterol can often be prevented with diet and lifestyle changes. If you already have high cholesterol, you can control it with diet, lifestyle changes, and medicines. How can high cholesterol affect me? If you have high cholesterol, fatty deposits (plaques) may build up on the walls of your blood vessels. The blood vessels that carry blood away from your heart are called arteries. Plaques make the arteries narrower and stiffer. This in turn can: Restrict or block blood flow and cause blood clots to form. Increase your  risk for heart attack and stroke. What can increase my risk for high cholesterol? This condition is more likely to develop in people who: Eat foods that are high in saturated fat or cholesterol. Saturated fat is mostly found in foods that come from animal sources. Are overweight. Are not getting enough exercise. Use products that contain nicotine or tobacco, such as cigarettes, e-cigarettes, and chewing tobacco. Have a family history of high cholesterol (familial hypercholesterolemia). What actions can I take to prevent this? Nutrition  Eat less saturated fat. Avoid trans fats (partially hydrogenated oils). These are often found in margarine and in some baked goods, fried foods, and snacks bought in packages. Avoid precooked or cured meat, such as bacon, sausages, or meat loaves. Avoid foods and drinks that have added sugars. Eat more fruits, vegetables, and whole grains. Choose healthy sources of protein, such as fish, poultry, lean cuts of red meat, beans, peas, lentils, and nuts. Choose healthy sources of fat, such as: Nuts. Vegetable oils, especially olive oil. Fish that have healthy fats, such as omega-3 fatty acids. These fish include mackerel or salmon. Lifestyle Lose weight if you are overweight. Maintaining a healthy body mass index (BMI) can help prevent or control high cholesterol. It can also lower your risk for diabetes and high blood pressure. Ask your health care provider to help you with a diet and exercise plan to lose weight safely. Do not use any products that contain nicotine or tobacco. These products include cigarettes, chewing tobacco, and vaping devices, such as e-cigarettes. If you need help quitting, ask your health care provider. Alcohol use   Do not drink alcohol if: Your health care provider tells you not to drink. You are pregnant, may be pregnant, or are planning to become pregnant. If you drink alcohol: Limit how much you have to: 0-1 drink a day for  women. 0-2 drinks a day for men. Know how much alcohol is in your drink. In the [place] one drink equals one 12 oz bottle of beer (355 mL), one 5 oz glass of wine (148 mL), or one 1 oz glass of hard liquor (44 mL). Activity  Get enough exercise. Do exercises as told by your health care provider. Each week, do at least 150 minutes of exercise that takes a medium level of effort (moderate-intensity exercise). This kind of exercise: Makes your heart beat faster while allowing you to still be able to talk. Can be done in short sessions several times a day or longer sessions a few times a week. For example, on 5 days each week, you could walk fast or ride your bike 3 times a day for 10 minutes each time. Medicines Your health care provider may recommend medicines to help lower cholesterol. This may be a medicine to lower the amount of cholesterol that your liver makes. You may need medicine if: Diet and lifestyle changes have not lowered your cholesterol enough. You have high cholesterol and other risk factors for heart disease or stroke. Take over-the-counter and prescription medicines only as told by your health care provider. General information Manage your risk factors for high cholesterol. Talk with your health care provider about all your risk factors and how to lower your risk. Manage other conditions that you have, such as diabetes or high blood pressure (hypertension). Have blood tests to check your cholesterol levels at regular points in time as told by your health care provider. Keep all follow-up visits. This is important. Where to find more information American Heart Association: www.heart.org National Heart, Lung, and Blood Institute: www.nhlbi.nih.gov Summary High cholesterol increases your risk for heart disease and stroke. By keeping your cholesterol level low, you can reduce your risk for these conditions. High cholesterol can often be prevented with diet and lifestyle  changes. Work with your health care provider to manage your risk factors, and have your blood tested regularly. This information is not intended to replace advice given to you by your health care provider. Make sure you discuss any questions you have with your health care provider. Document Revised: 03/22/2022 Document Reviewed: 10/23/2020 Elsevier Patient Education  2024 Elsevier Inc.  

## 2023-02-24 ENCOUNTER — Other Ambulatory Visit: Payer: Self-pay

## 2023-02-24 ENCOUNTER — Ambulatory Visit (INDEPENDENT_AMBULATORY_CARE_PROVIDER_SITE_OTHER): Payer: Medicare Other | Admitting: Nurse Practitioner

## 2023-02-24 ENCOUNTER — Encounter: Payer: Self-pay | Admitting: Nurse Practitioner

## 2023-02-24 VITALS — BP 135/77 | HR 64 | Temp 97.7°F | Ht 70.0 in | Wt 214.2 lb

## 2023-02-24 DIAGNOSIS — I10 Essential (primary) hypertension: Secondary | ICD-10-CM

## 2023-02-24 DIAGNOSIS — M8589 Other specified disorders of bone density and structure, multiple sites: Secondary | ICD-10-CM | POA: Diagnosis not present

## 2023-02-24 DIAGNOSIS — F17210 Nicotine dependence, cigarettes, uncomplicated: Secondary | ICD-10-CM

## 2023-02-24 DIAGNOSIS — K219 Gastro-esophageal reflux disease without esophagitis: Secondary | ICD-10-CM

## 2023-02-24 DIAGNOSIS — E7800 Pure hypercholesterolemia, unspecified: Secondary | ICD-10-CM

## 2023-02-24 DIAGNOSIS — Z6831 Body mass index (BMI) 31.0-31.9, adult: Secondary | ICD-10-CM

## 2023-02-24 DIAGNOSIS — J432 Centrilobular emphysema: Secondary | ICD-10-CM

## 2023-02-24 DIAGNOSIS — M1380 Other specified arthritis, unspecified site: Secondary | ICD-10-CM

## 2023-02-24 DIAGNOSIS — M1990 Unspecified osteoarthritis, unspecified site: Secondary | ICD-10-CM

## 2023-02-24 DIAGNOSIS — N400 Enlarged prostate without lower urinary tract symptoms: Secondary | ICD-10-CM

## 2023-02-24 DIAGNOSIS — E66811 Obesity, class 1: Secondary | ICD-10-CM

## 2023-02-24 DIAGNOSIS — E6609 Other obesity due to excess calories: Secondary | ICD-10-CM

## 2023-02-24 DIAGNOSIS — I700 Atherosclerosis of aorta: Secondary | ICD-10-CM | POA: Diagnosis not present

## 2023-02-24 DIAGNOSIS — R7309 Other abnormal glucose: Secondary | ICD-10-CM | POA: Diagnosis not present

## 2023-02-24 DIAGNOSIS — Z87891 Personal history of nicotine dependence: Secondary | ICD-10-CM

## 2023-02-24 LAB — MICROALBUMIN, URINE WAIVED
Creatinine, Urine Waived: 10 mg/dL (ref 10–300)
Microalb, Ur Waived: 10 mg/L (ref 0–19)
Microalb/Creat Ratio: <30 mg/g (ref <30)

## 2023-02-24 LAB — BAYER DCA HB A1C WAIVED: HB A1C (BAYER DCA - WAIVED): 6.1 % — ABNORMAL HIGH (ref 4.8–5.6)

## 2023-02-24 MED ORDER — BREZTRI AEROSPHERE 160-9-4.8 MCG/ACT IN AERO: 2.0000 | INHALATION_SPRAY | Freq: Two times a day (BID) | RESPIRATORY_TRACT | 11 refills | Status: DC

## 2023-02-24 NOTE — Progress Notes (Signed)
BP 135/77   Pulse 64   Temp 97.7 F (36.5 C) (Oral)   Ht 5\' 10"  (1.778 m)   Wt 214 lb 3.2 oz (97.2 kg)   SpO2 95%   BMI 30.73 kg/m    Subjective:    Patient ID: Hunter Huffman, male    DOB: 1960-09-21, 62 y.o.   MRN: [id number]  HPI: JOHNWESLEY Huffman is a 62 y.o. male  Chief Complaint  Patient presents with   Diabetes   Hypertension   Centrilobular emphysema   HYPERTENSION/HYPERLIPIDEMIA Continues on HCTZ daily.  History of poor reaction with Atorvastatin.  He continues to refuse statin therapy, have had at length discussions on this with him and risk levels.  A1c being monitored at visits, last check 6% December.   Hypertension status: stable  Satisfied with current treatment? yes Duration of hypertension: chronic BP monitoring frequency:  not checking BP range:  BP medication side effects:  no Medication compliance: good compliance Previous BP meds: Aspirin: no Recurrent headaches: no Visual changes: no Palpitations: no Dyspnea: at baseline, constant -- can climb full flight of steps but tires him Chest pain: no Lower extremity edema: no Dizzy/lightheaded: no  The 10-year ASCVD risk score (Arnett DK, et al., 2019) is: 19.3%   Values used to calculate the score:     Age: 24 years     Sex: Male     Is Non-Hispanic African American: No     Diabetic: No     Tobacco smoker: Yes     Systolic Blood Pressure: 135 mmHg     Is BP treated: Yes     HDL Cholesterol: 36 mg/dL     Total Cholesterol: 156 mg/dL  COPD Continues on Stiolto daily and Albuterol as needed.  He does notice more shortness of breath and coughing up phlegm.  Continues to smoke, smoking about 1/2 PPD.  Has been smoking since teen years.  Recent lung screening on 02/18/23 noted severe centrilobular emphysema and presence of aortic atherosclerosis and diffuse osteopenia.  Is followed by rheumatology for inflammatory arthritis with last visit on 12/20/22, Enbrel was increased to every 7 days dosing --  he stretches this out due to cost. COPD status: stable Satisfied with current treatment?: yes Oxygen use: no Dyspnea frequency: yes with activity Cough frequency: yes often with activity Rescue inhaler frequency: none Limitation of activity: no Productive cough: no Last Spirometry: 10/17/21 Pneumovax: Up To Date Influenza: Up to Date   GERD Continues on Omeprazole. GERD control status: stable Satisfied with current treatment? yes Heartburn frequency: none Medication side effects: no  Medication compliance: stable Dysphagia: no Odynophagia:  no Hematemesis: no Blood in stool: no EGD: yes   Relevant past medical, surgical, family and social history reviewed and updated as indicated. Interim medical history since our last visit reviewed. Allergies and medications reviewed and updated.  Review of Systems  Constitutional:  Negative for activity change, appetite change, diaphoresis, fatigue and unexpected weight change.  Respiratory:  Positive for cough and shortness of breath. Negative for chest tightness and wheezing.   Cardiovascular:  Negative for chest pain, palpitations and leg swelling.  Gastrointestinal: Negative.   Skin: Negative.   Neurological: Negative.   Psychiatric/Behavioral: Negative.      Per HPI unless specifically indicated above     Objective:    BP 135/77   Pulse 64   Temp 97.7 F (36.5 C) (Oral)   Ht 5\' 10"  (1.778 m)   Wt 214 lb 3.2 oz (97.2  kg)   SpO2 95%   BMI 30.73 kg/m   Wt Readings from Last 3 Encounters:  02/24/23 214 lb 3.2 oz (97.2 kg)  12/16/22 216 lb (98 kg)  08/23/22 216 lb 12.8 oz (98.3 kg)    Physical Exam Vitals and nursing note reviewed.  Constitutional:      General: He is awake. He is not in acute distress.    Appearance: He is well-developed and well-groomed. He is obese. He is not ill-appearing or toxic-appearing.  HENT:     Head: Normocephalic and atraumatic.     Right Ear: Hearing normal. No drainage.     Left Ear:  Hearing normal. No drainage.  Eyes:     General: Lids are normal.        Right eye: No discharge.        Left eye: No discharge.     Conjunctiva/sclera: Conjunctivae normal.     Pupils: Pupils are equal, round, and reactive to light.  Neck:     Thyroid: No thyromegaly.     Vascular: No carotid bruit.  Cardiovascular:     Rate and Rhythm: Normal rate and regular rhythm.     Heart sounds: Normal heart sounds, S1 normal and S2 normal. No murmur heard.    No gallop.  Pulmonary:     Effort: Pulmonary effort is normal. No accessory muscle usage or respiratory distress.     Breath sounds: Normal breath sounds.  Abdominal:     General: Bowel sounds are normal. There is no distension.     Palpations: Abdomen is soft.     Tenderness: There is no abdominal tenderness.  Musculoskeletal:        General: Normal range of motion.     Cervical back: Normal range of motion and neck supple.     Right lower leg: No edema.     Left lower leg: No edema.  Lymphadenopathy:     Cervical: No cervical adenopathy.  Skin:    General: Skin is warm and dry.     Capillary Refill: Capillary refill takes less than 2 seconds.  Neurological:     Mental Status: He is alert and oriented to person, place, and time.     Deep Tendon Reflexes: Reflexes are normal and symmetric.  Psychiatric:        Mood and Affect: Mood normal.        Speech: Speech normal.        Behavior: Behavior normal. Behavior is cooperative.        Thought Content: Thought content normal.    Results for orders placed or performed in visit on 08/23/22  HgB A1c  Result Value Ref Range   Hgb A1c MFr Bld 6.0 (H) 4.8 - 5.6 %   Est. average glucose Bld gHb Est-mCnc 126 mg/dL  Comprehensive metabolic panel  Result Value Ref Range   Glucose 82 70 - 99 mg/dL   BUN 13 8 - 27 mg/dL   Creatinine, Ser 2.95 0.76 - 1.27 mg/dL   eGFR 73 >62 ZH/YQM/5.78   BUN/Creatinine Ratio 11 10 - 24   Sodium 139 134 - 144 mmol/L   Potassium 4.4 3.5 - 5.2  mmol/L   Chloride 101 96 - 106 mmol/L   CO2 26 20 - 29 mmol/L   Calcium 9.5 8.6 - 10.2 mg/dL   Total Protein 6.1 6.0 - 8.5 g/dL   Albumin 4.2 3.9 - 4.9 g/dL   Globulin, Total 1.9 1.5 - 4.5 g/dL   Albumin/Globulin  Ratio 2.2 1.2 - 2.2   Bilirubin Total 0.5 0.0 - 1.2 mg/dL   Alkaline Phosphatase 82 44 - 121 IU/L   AST 20 0 - 40 IU/L   ALT 25 0 - 44 IU/L  Lipid Panel w/o Chol/HDL Ratio  Result Value Ref Range   Cholesterol, Total 156 100 - 199 mg/dL   Triglycerides 956 (H) 0 - 149 mg/dL   HDL 36 (L) >21 mg/dL   VLDL Cholesterol Cal 27 5 - 40 mg/dL   LDL Chol Calc (NIH) 93 0 - 99 mg/dL      Assessment & Plan:   Problem List Items Addressed This Visit       Cardiovascular and Mediastinum   Aortic atherosclerosis (HCC)    Chronic.  Noted on CT imaging 11/13/21.  Recommend complete cessation of smoking and addition of statin in future for cholesterol control and prevention -- he refuses this.      Hypertension    Chronic, stable.  BP at goal in office.  Recommend he monitor BP at least a few mornings a week at home and document.  DASH diet at home.  Continue current medication regimen and adjust as needed.  Labs today: CMP, Lipid, urine ALB.  Urine ALB 10 February 2023.  Refills up to date.       Relevant Orders   Microalbumin, Urine Waived   Comprehensive metabolic panel   TSH     Respiratory   Centrilobular emphysema (HCC) - Primary    Chronic, ongoing -- severe on recent CT imaging.  Spirometry February 2023 FEV1 32% and FEV1/FVC 57%.  Recommend continued cessation of smoking.  Stop Stiolto and start Breztri due to his symptoms and CT findings, may benefit more from triple therapy.  Educated him on this inhaler and use + side effects.  Continue Albuterol as needed, especially before activity.  Educated him on importance of proper inhaler use due to progressive nature of disease.  Lung cancer screening annually, next 02/18/24.      Relevant Medications    Budeson-Glycopyrrol-Formoterol (BREZTRI AEROSPHERE) 160-9-4.8 MCG/ACT AERO     Digestive   GERD (gastroesophageal reflux disease)    Chronic, stable, continues Prilosec for GI protection with his inflammatory arthritis and Naproxen use.  Continue this and check Mag level today.      Relevant Orders   Magnesium     Musculoskeletal and Integument   Chronic inflammatory arthritis    Chronic, followed with rheumatology at [facility], continue this collaboration and medications as ordered by them.      Osteopenia    Diffuse noted on imaging 02/18/23.  Recommend taking Vitamin D3 2000 units daily and ensuring plenty of exercise to help with muscle mass and bone health.  Recommend complete cessation of smoking.      Relevant Orders   VITAMIN D 25 Hydroxy (Vit-D Deficiency, Fractures)     Other   Elevated hemoglobin A1c measurement    A1c is trending up today to 6.1%, previous in December 6%.  Urine ALB 10 February 2023.  Recommend heavy focus on diet and regular exercise to prevent ongoing trend up in this level.  Discussed risks at length.      Relevant Orders   Bayer DCA Hb A1c Waived   Microalbumin, Urine Waived   Hypercholesteremia    Chronic, ongoing with history of poor tolerance to Atorvastatin.  Recheck lipid panel today, have low threshold for starting statin, which discussed with patient.  Discussed trial of Rosuvastatin, which he  may tolerate better, if ongoing elevation of LDL.  He refuses at this time.      Relevant Orders   Lipid Panel w/o Chol/HDL Ratio   Comprehensive metabolic panel   Nicotine dependence, cigarettes, uncomplicated    I have recommended complete cessation of tobacco use. I have discussed various options available for assistance with tobacco cessation including over the counter methods (Nicotine gum, patch and lozenges). We also discussed prescription options (Chantix, Nicotine Inhaler / Nasal Spray). The patient is not interested in pursuing any  prescription tobacco cessation options at this time.       Obesity    BMI 30.73.  Recommended eating smaller high protein, low fat meals more frequently and exercising 30 mins a day 5 times a week with a goal of 10-15lb weight loss in the next 3 months. Patient voiced their understanding and motivation to adhere to these recommendations.      Other Visit Diagnoses     Benign prostatic hyperplasia without lower urinary tract symptoms       PSA on labs today.   Relevant Orders   PSA        Follow up plan: Return in about 8 weeks (around 04/21/2023) for COPD -- changed to [facility] + 6 month follow-up for Prediabetes, HTN/HLD, COPD.

## 2023-02-24 NOTE — Assessment & Plan Note (Signed)
Chronic, followed with rheumatology at [facility], continue this collaboration and medications as ordered by them.

## 2023-02-24 NOTE — Assessment & Plan Note (Signed)
Diffuse noted on imaging 02/18/23.  Recommend taking Vitamin D3 2000 units daily and ensuring plenty of exercise to help with muscle mass and bone health.  Recommend complete cessation of smoking.

## 2023-02-24 NOTE — Assessment & Plan Note (Signed)
Chronic, ongoing -- severe on recent CT imaging.  Spirometry February 2023 FEV1 32% and FEV1/FVC 57%.  Recommend continued cessation of smoking.  Stop Stiolto and start Breztri due to his symptoms and CT findings, may benefit more from triple therapy.  Educated him on this inhaler and use + side effects.  Continue Albuterol as needed, especially before activity.  Educated him on importance of proper inhaler use due to progressive nature of disease.  Lung cancer screening annually, next 02/18/24.

## 2023-02-24 NOTE — Assessment & Plan Note (Signed)
Chronic, ongoing with history of poor tolerance to Atorvastatin.  Recheck lipid panel today, have low threshold for starting statin, which discussed with patient.  Discussed trial of Rosuvastatin, which he may tolerate better, if ongoing elevation of LDL.  He refuses at this time. 

## 2023-02-24 NOTE — Assessment & Plan Note (Signed)
A1c is trending up today to 6.1%, previous in December 6%.  Urine ALB 10 February 2023.  Recommend heavy focus on diet and regular exercise to prevent ongoing trend up in this level.  Discussed risks at length.

## 2023-02-24 NOTE — Assessment & Plan Note (Signed)
BMI 30.73.  Recommended eating smaller high protein, low fat meals more frequently and exercising 30 mins a day 5 times a week with a goal of 10-15lb weight loss in the next 3 months. Patient voiced their understanding and motivation to adhere to these recommendations. ° °

## 2023-02-24 NOTE — Assessment & Plan Note (Signed)
I have recommended complete cessation of tobacco use. I have discussed various options available for assistance with tobacco cessation including over the counter methods (Nicotine gum, patch and lozenges). We also discussed prescription options (Chantix, Nicotine Inhaler / Nasal Spray). The patient is not interested in pursuing any prescription tobacco cessation options at this time.  

## 2023-02-24 NOTE — Assessment & Plan Note (Signed)
Chronic.  Noted on CT imaging 11/13/21.  Recommend complete cessation of smoking and addition of statin in future for cholesterol control and prevention -- he refuses this. 

## 2023-02-24 NOTE — Assessment & Plan Note (Signed)
Chronic, stable.  BP at goal in office.  Recommend he monitor BP at least a few mornings a week at home and document.  DASH diet at home.  Continue current medication regimen and adjust as needed.  Labs today: CMP, Lipid, urine ALB.  Urine ALB 10 February 2023.  Refills up to date.

## 2023-02-24 NOTE — Assessment & Plan Note (Signed)
Chronic, stable, continues Prilosec for GI protection with his inflammatory arthritis and Naproxen use.  Continue this and check Mag level today. °

## 2023-02-25 ENCOUNTER — Ambulatory Visit: Payer: Self-pay | Admitting: *Deleted

## 2023-02-25 ENCOUNTER — Other Ambulatory Visit: Payer: Self-pay | Admitting: Nurse Practitioner

## 2023-02-25 LAB — LIPID PANEL W/O CHOL/HDL RATIO
Cholesterol, Total: 152 mg/dL (ref 100–199)
HDL: 43 mg/dL (ref >39)
LDL Chol Calc (NIH): 96 mg/dL (ref 0–99)
Triglycerides: 67 mg/dL (ref 0–149)
VLDL Cholesterol Cal: 13 mg/dL (ref 5–40)

## 2023-02-25 LAB — COMPREHENSIVE METABOLIC PANEL
ALT: 17 IU/L (ref 0–44)
AST: 18 IU/L (ref 0–40)
Albumin: 4.1 g/dL (ref 3.9–4.9)
Alkaline Phosphatase: 84 IU/L (ref 44–121)
BUN/Creatinine Ratio: 8 — ABNORMAL LOW (ref 10–24)
BUN: 9 mg/dL (ref 8–27)
Bilirubin Total: 0.6 mg/dL (ref 0.0–1.2)
CO2: 23 mmol/L (ref 20–29)
Calcium: 9.2 mg/dL (ref 8.6–10.2)
Chloride: 101 mmol/L (ref 96–106)
Creatinine, Ser: 1.11 mg/dL (ref 0.76–1.27)
Globulin, Total: 2 g/dL (ref 1.5–4.5)
Glucose: 89 mg/dL (ref 70–99)
Potassium: 4.4 mmol/L (ref 3.5–5.2)
Sodium: 142 mmol/L (ref 134–144)
Total Protein: 6.1 g/dL (ref 6.0–8.5)
eGFR: 75 mL/min/{1.73_m2} (ref >59)

## 2023-02-25 LAB — TSH: TSH: 2.29 u[IU]/mL (ref 0.450–4.500)

## 2023-02-25 LAB — PSA: Prostate Specific Ag, Serum: 1.8 ng/mL (ref 0.0–4.0)

## 2023-02-25 LAB — MAGNESIUM: Magnesium: 1.9 mg/dL (ref 1.6–2.3)

## 2023-02-25 LAB — VITAMIN D 25 HYDROXY (VIT D DEFICIENCY, FRACTURES): Vit D, 25-Hydroxy: 46.5 ng/mL (ref 30.0–100.0)

## 2023-02-25 MED ORDER — ROSUVASTATIN CALCIUM 10 MG PO TABS
10.0000 mg | ORAL_TABLET | ORAL | 4 refills | Status: DC
Start: 2023-02-26 — End: 2024-03-09

## 2023-02-25 NOTE — Telephone Encounter (Signed)
Pt given lab results per notes of Aura Dials, NP on 12:42 PM.   Pt verbalized understanding.  Reason for Disposition  [1] Follow-up call to recent contact AND [2] information only call, no triage required  Answer Assessment - Initial Assessment Questions 1. REASON FOR CALL or QUESTION: "What is your reason for calling today?" or "How can I best help you?" or "What question do you have that I can help answer?"     Lab results given  Protocols used: Information Only Call - No Triage-A-AH

## 2023-02-25 NOTE — Telephone Encounter (Signed)
  Chief Complaint: Pt returned call and was given his lab results. Symptoms: N/A Frequency: N/A Pertinent Negatives: Patient denies N/A Disposition: [] ED /[] Urgent Care (no appt availability in office) / [] Appointment(In office/virtual)/ []  [place] [facility]/ [x] Home Care/ [] Refused Recommended Disposition /[] [place] [facility]/ []  Follow-up with PCP Additional Notes:

## 2023-03-19 DIAGNOSIS — M47819 Spondylosis without myelopathy or radiculopathy, site unspecified: Secondary | ICD-10-CM | POA: Diagnosis not present

## 2023-04-19 NOTE — Patient Instructions (Signed)
 Be Involved in Caring For Your Health:  Taking Medications When medications are taken as directed, they can greatly improve your health. But if they are not taken as prescribed, they may not work. In some cases, not taking them correctly can be harmful. To help ensure your treatment remains effective and safe, understand your medications and how to take them. Bring your medications to each visit for review by your provider.  Your lab results, notes, and after visit summary will be available on My Chart. We strongly encourage you to use this feature. If lab results are abnormal the clinic will contact you with the appropriate steps. If the clinic does not contact you assume the results are satisfactory. You can always view your results on My Chart. If you have questions regarding your health or results, please contact the clinic during office hours. You can also ask questions on My Chart.  We at [facility] are grateful that you chose [place] to provide your care. We strive to provide evidence-based and compassionate care and are always looking for feedback. If you get a survey from the clinic please complete this so we can hear your opinions.  Eating Plan for Chronic Obstructive Pulmonary Disease Chronic obstructive pulmonary disease (COPD) causes symptoms such as shortness of breath, coughing, and chest discomfort. These symptoms can make it difficult to eat enough to maintain a healthy weight. Generally, people with COPD should eat a diet that is high in calories, protein, and other nutrients to maintain body weight and to keep the lungs as healthy as possible. Depending on the medicines you take and other health conditions you may have, your health care provider may give you additional recommendations on what to eat or avoid. Talk with your health care provider about your goals for body weight, and work with a dietitian to develop an eating plan that is right for you. What are tips for  following this plan? Reading food labels  Avoid foods with more than 300 milligrams (mg) of salt (sodium) per serving. Choose foods that contain at least 4 grams (g) of fiber per serving. Try to eat 20-30 g of fiber each day. Choose foods that are high in calories and protein, such as nuts, beans, yogurt, and cheese. Shopping Do not buy foods labeled as diet, low-calorie, or low-fat. If you are able to eat dairy products: Avoid low-fat or skim milk. Buy dairy products that have at least 2% fat. Buy nutritional supplement drinks. Buy grains and prepared foods labeled as enriched or fortified. Consider buying low-sodium, pre-made foods to conserve energy for eating. Cooking Add dry milk or protein powder to smoothies. Cook with healthy fats, such as olive oil, canola oil, sunflower oil, and grapeseed oil. Add oil, butter, cream cheese, or nut butters to foods to increase fat and calories. To make foods easier to chew and swallow: Cook vegetables, pasta, and rice until soft. Cut or grind meat into very small pieces. Dip breads in liquid. Meal planning  Eat when you feel hungry. Eat 5-6 small meals throughout the day. Drink 6-8 glasses of water each day. Do not drink liquids with meals. Drink liquids at the end of the meal to avoid feeling full too quickly. Eat a variety of fruits and vegetables every day. Ask for assistance from family or friends with planning and preparing meals as needed. Avoid foods that cause you to feel bloated, such as carbonated drinks, fried foods, beans, broccoli, cabbage, and apples. For older adults, ask your local  agency on aging whether you are eligible for meal assistance programs, such as Meals on Wheels. Lifestyle  Do not smoke. Eat slowly. Take small bites and chew food well before swallowing. Do not overeat. This may make it more difficult to breathe after eating. Sit up while eating. If needed, continue to use supplemental oxygen while  eating. Rest or relax for 30 minutes before and after eating. Monitor your weight as told by your health care provider. Exercise as told by your health care provider. What foods should I eat? Fruits All fresh, dried, canned, or frozen fruits that do not cause gas. Vegetables All fresh, canned (no salt added), or frozen vegetables that do not cause gas. Grains Whole-grain bread. Enriched whole-grain pasta. Fortified whole-grain cereals. Fortified rice. Quinoa. Meats and other proteins Lean meat. Poultry. Fish. Dried beans. Unsalted nuts. Tofu. Eggs. Nut butters. Dairy Whole or 2% milk. Cheese. Yogurt. Fats and oils Olive oil. Canola oil. Butter. Margarine. Beverages Water. Vegetable juice (no salt added). Decaffeinated coffee. Decaffeinated or herbal tea. Seasonings and condiments Fresh or dried herbs. Low-salt or salt-free seasonings. Low-sodium soy sauce. The items listed above may not be a complete list of foods and beverages you can eat. Contact a dietitian for more information. What foods should I avoid? Fruits Fruits that cause gas, such as apples or melon. Vegetables Vegetables that cause gas, such as broccoli, Brussels sprouts, cabbage, cauliflower, and onions. Canned vegetables with added salt. Meats and other proteins Fried meat. Salt-cured meat. Processed meat. Dairy Fat-free or low-fat milk, yogurt, or cheese. Processed cheese. Beverages Carbonated drinks. Caffeinated drinks, such as coffee, tea, and soft drinks. Juice. Alcohol. Vegetable juice with added salt. Seasonings and condiments Salt. Seasoning mixes with salt. Soy sauce. Rosita Fire. Other foods Clear soup or broth. Fried foods. Prepared frozen meals. The items listed above may not be a complete list of foods and beverages you should avoid. Contact a dietitian for more information. Summary COPD symptoms can make it difficult to eat enough to maintain a healthy weight. A COPD eating plan can help you maintain  your body weight and keep your lungs as healthy as possible. Eat a diet that is high in calories, protein, and other nutrients. Read labels to make sure that you are getting the right nutrients. Cook foods to make them easier to chew and swallow. Eat 5-6 small meals throughout the day, and avoid foods that cause gas or make you feel bloated. This information is not intended to replace advice given to you by your health care provider. Make sure you discuss any questions you have with your health care provider. Document Revised: 06/27/2020 Document Reviewed: 06/27/2020 Elsevier Patient Education  2024 [employer].

## 2023-04-21 ENCOUNTER — Encounter: Payer: Self-pay | Admitting: Nurse Practitioner

## 2023-04-21 ENCOUNTER — Ambulatory Visit: Payer: Medicare Other | Admitting: Nurse Practitioner

## 2023-04-21 VITALS — BP 121/71 | HR 67 | Temp 97.7°F | Wt 216.0 lb

## 2023-04-21 DIAGNOSIS — F17210 Nicotine dependence, cigarettes, uncomplicated: Secondary | ICD-10-CM

## 2023-04-21 DIAGNOSIS — E6609 Other obesity due to excess calories: Secondary | ICD-10-CM

## 2023-04-21 DIAGNOSIS — J432 Centrilobular emphysema: Secondary | ICD-10-CM

## 2023-04-21 DIAGNOSIS — Z6830 Body mass index (BMI) 30.0-30.9, adult: Secondary | ICD-10-CM

## 2023-04-21 DIAGNOSIS — E7800 Pure hypercholesterolemia, unspecified: Secondary | ICD-10-CM | POA: Diagnosis not present

## 2023-04-21 NOTE — Progress Notes (Signed)
BP 121/71   Pulse 67   Temp 97.7 F (36.5 C) (Oral)   Wt 216 lb (98 kg)   SpO2 97%   BMI 30.99 kg/m    Subjective:    Patient ID: Hunter Huffman, male    DOB: August 18, 1961, 62 y.o.   MRN: [id number]  HPI: Hunter Huffman is a 62 y.o. male  Chief Complaint  Patient presents with   COPD    8 week f/up   COPD Continues on Breztri, which was started last visit, does not notice much difference.  Albuterol as needed.  He continues to smoke, less then one pack per day.  He was about 14 when he started.  Does not want to go to pulmonary at this time for more testing.   COPD status: stable Satisfied with current treatment?: yes Oxygen use: no Dyspnea frequency: if does strenuous activity Cough frequency: occasionally Rescue inhaler frequency:  occasional with mowing lawn Limitation of activity: no Productive cough: none Last Spirometry: FEV1 52% and FEV1/FVC 62% on 04/21/23 (moderate severe) Pneumovax: Up to Date Influenza: Up to Date   HYPERLIPIDEMIA In past took Atorvastatin which caused him to be sick.  Currently taking Rosuvastatin three days a week without ADR. Hyperlipidemia status: good compliance Satisfied with current treatment?  yes Side effects:  no Medication compliance: good compliance Supplements: none Aspirin:  no The 10-year ASCVD risk score (Arnett DK, et al., 2019) is: 14.1%   Values used to calculate the score:     Age: 89 years     Sex: Male     Is Non-Hispanic African American: No     Diabetic: No     Tobacco smoker: Yes     Systolic Blood Pressure: 121 mmHg     Is BP treated: Yes     HDL Cholesterol: 43 mg/dL     Total Cholesterol: 152 mg/dL Chest pain:  no Coronary artery disease:  yes Family history CAD:  yes Family history early CAD:  no  Relevant past medical, surgical, family and social history reviewed and updated as indicated. Interim medical history since our last visit reviewed. Allergies and medications reviewed and  updated.  Review of Systems  Constitutional:  Negative for activity change, appetite change, diaphoresis, fatigue and unexpected weight change.  Respiratory:  Positive for cough and shortness of breath (with heavy exertion). Negative for chest tightness and wheezing.   Cardiovascular:  Negative for chest pain, palpitations and leg swelling.  Gastrointestinal: Negative.   Skin: Negative.   Neurological: Negative.   Psychiatric/Behavioral: Negative.      Per HPI unless specifically indicated above     Objective:    BP 121/71   Pulse 67   Temp 97.7 F (36.5 C) (Oral)   Wt 216 lb (98 kg)   SpO2 97%   BMI 30.99 kg/m   Wt Readings from Last 3 Encounters:  04/21/23 216 lb (98 kg)  02/24/23 214 lb 3.2 oz (97.2 kg)  12/16/22 216 lb (98 kg)    Physical Exam Vitals and nursing note reviewed.  Constitutional:      General: He is awake. He is not in acute distress.    Appearance: He is well-developed and well-groomed. He is obese. He is not ill-appearing or toxic-appearing.  HENT:     Head: Normocephalic and atraumatic.     Right Ear: Hearing normal. No drainage.     Left Ear: Hearing normal. No drainage.  Eyes:     General: Lids are normal.  Right eye: No discharge.        Left eye: No discharge.     Conjunctiva/sclera: Conjunctivae normal.     Pupils: Pupils are equal, round, and reactive to light.  Neck:     Thyroid: No thyromegaly.     Vascular: No carotid bruit.  Cardiovascular:     Rate and Rhythm: Normal rate and regular rhythm.     Heart sounds: Normal heart sounds, S1 normal and S2 normal. No murmur heard.    No gallop.  Pulmonary:     Effort: Pulmonary effort is normal. No accessory muscle usage or respiratory distress.     Breath sounds: Normal breath sounds.  Abdominal:     General: Bowel sounds are normal. There is no distension.     Palpations: Abdomen is soft.     Tenderness: There is no abdominal tenderness.  Musculoskeletal:        General: Normal  range of motion.     Cervical back: Normal range of motion and neck supple.     Right lower leg: No edema.     Left lower leg: No edema.  Lymphadenopathy:     Cervical: No cervical adenopathy.  Skin:    General: Skin is warm and dry.     Capillary Refill: Capillary refill takes less than 2 seconds.  Neurological:     Mental Status: He is alert and oriented to person, place, and time.     Deep Tendon Reflexes: Reflexes are normal and symmetric.  Psychiatric:        Mood and Affect: Mood normal.        Speech: Speech normal.        Behavior: Behavior normal. Behavior is cooperative.        Thought Content: Thought content normal.    Results for orders placed or performed in visit on 02/24/23  Bayer DCA Hb A1c Waived  Result Value Ref Range   HB A1C (BAYER DCA - WAIVED) 6.1 (H) 4.8 - 5.6 %  Microalbumin, Urine Waived  Result Value Ref Range   Microalb, Ur Waived 10 0 - 19 mg/L   Creatinine, Urine Waived 10 10 - 300 mg/dL   Microalb/Creat Ratio <30 <30 mg/g  Lipid Panel w/o Chol/HDL Ratio  Result Value Ref Range   Cholesterol, Total 152 100 - 199 mg/dL   Triglycerides 67 0 - 149 mg/dL   HDL 43 >16 mg/dL   VLDL Cholesterol Cal 13 5 - 40 mg/dL   LDL Chol Calc (NIH) 96 0 - 99 mg/dL  Comprehensive metabolic panel  Result Value Ref Range   Glucose 89 70 - 99 mg/dL   BUN 9 8 - 27 mg/dL   Creatinine, Ser 1.09 0.76 - 1.27 mg/dL   eGFR 75 >60 AV/WUJ/8.11   BUN/Creatinine Ratio 8 (L) 10 - 24   Sodium 142 134 - 144 mmol/L   Potassium 4.4 3.5 - 5.2 mmol/L   Chloride 101 96 - 106 mmol/L   CO2 23 20 - 29 mmol/L   Calcium 9.2 8.6 - 10.2 mg/dL   Total Protein 6.1 6.0 - 8.5 g/dL   Albumin 4.1 3.9 - 4.9 g/dL   Globulin, Total 2.0 1.5 - 4.5 g/dL   Bilirubin Total 0.6 0.0 - 1.2 mg/dL   Alkaline Phosphatase 84 44 - 121 IU/L   AST 18 0 - 40 IU/L   ALT 17 0 - 44 IU/L  TSH  Result Value Ref Range   TSH 2.290 0.450 - 4.500 uIU/mL  VITAMIN D 25 Hydroxy (Vit-D Deficiency, Fractures)   Result Value Ref Range   Vit D, 25-Hydroxy 46.5 30.0 - 100.0 ng/mL  PSA  Result Value Ref Range   Prostate Specific Ag, Serum 1.8 0.0 - 4.0 ng/mL  Magnesium  Result Value Ref Range   Magnesium 1.9 1.6 - 2.3 mg/dL      Assessment & Plan:   Problem List Items Addressed This Visit       Respiratory   Centrilobular emphysema (HCC) - Primary    Chronic, ongoing -- severe on recent CT imaging and spirometry moderate/severe noted.  Spirometry done 04/21/23.  Recommend complete cessation of smoking. Continue Breztri due to his symptom and stage of disease, benefits more from triple therapy.  Educated him on this inhaler and use + side effects.  Continue Albuterol as needed, especially before activity.  Educated him on importance of proper inhaler use due to progressive nature of disease.  Lung cancer screening annually, next 02/18/24.  Discussed that he would benefit from a visit with pulmonary for more testing and recommendations.  He refuses at this time.      Relevant Orders   Spirometry with Graph (Completed)     Other   Hypercholesteremia    Chronic, ongoing with history of poor tolerance to Atorvastatin.  Continue Rosuvastatin three days a week, which he is tolerating.  Lipid panel today.      Relevant Orders   Comprehensive metabolic panel   Lipid Panel w/o Chol/HDL Ratio   Nicotine dependence, cigarettes, uncomplicated    I have recommended complete cessation of tobacco use. I have discussed various options available for assistance with tobacco cessation including over the counter methods (Nicotine gum, patch and lozenges). We also discussed prescription options (Chantix, Nicotine Inhaler / Nasal Spray). The patient is not interested in pursuing any prescription tobacco cessation options at this time.       Obesity    BMI 30.99.  Recommended eating smaller high protein, low fat meals more frequently and exercising 30 mins a day 5 times a week with a goal of 10-15lb weight loss in  the next 3 months. Patient voiced their understanding and motivation to adhere to these recommendations.        Follow up plan: Return in about 5 months (around 09/26/2023) for COPD, HTN/HLD, GERD, A1c check.

## 2023-04-21 NOTE — Assessment & Plan Note (Signed)
BMI 30.99.  Recommended eating smaller high protein, low fat meals more frequently and exercising 30 mins a day 5 times a week with a goal of 10-15lb weight loss in the next 3 months. Patient voiced their understanding and motivation to adhere to these recommendations.

## 2023-04-21 NOTE — Assessment & Plan Note (Signed)
I have recommended complete cessation of tobacco use. I have discussed various options available for assistance with tobacco cessation including over the counter methods (Nicotine gum, patch and lozenges). We also discussed prescription options (Chantix, Nicotine Inhaler / Nasal Spray). The patient is not interested in pursuing any prescription tobacco cessation options at this time.  

## 2023-04-21 NOTE — Assessment & Plan Note (Signed)
Chronic, ongoing -- severe on recent CT imaging and spirometry moderate/severe noted.  Spirometry done 04/21/23.  Recommend complete cessation of smoking. Continue Breztri due to his symptom and stage of disease, benefits more from triple therapy.  Educated him on this inhaler and use + side effects.  Continue Albuterol as needed, especially before activity.  Educated him on importance of proper inhaler use due to progressive nature of disease.  Lung cancer screening annually, next 02/18/24.  Discussed that he would benefit from a visit with pulmonary for more testing and recommendations.  He refuses at this time.

## 2023-04-21 NOTE — Assessment & Plan Note (Signed)
Chronic, ongoing with history of poor tolerance to Atorvastatin.  Continue Rosuvastatin three days a week, which he is tolerating.  Lipid panel today.

## 2023-04-22 LAB — COMPREHENSIVE METABOLIC PANEL
ALT: 16 IU/L (ref 0–44)
AST: 20 IU/L (ref 0–40)
Albumin: 4.2 g/dL (ref 3.9–4.9)
Alkaline Phosphatase: 71 IU/L (ref 44–121)
BUN/Creatinine Ratio: 13 (ref 10–24)
BUN: 14 mg/dL (ref 8–27)
Bilirubin Total: 0.4 mg/dL (ref 0.0–1.2)
CO2: 24 mmol/L (ref 20–29)
Calcium: 9.5 mg/dL (ref 8.6–10.2)
Chloride: 102 mmol/L (ref 96–106)
Creatinine, Ser: 1.11 mg/dL (ref 0.76–1.27)
Globulin, Total: 1.9 g/dL (ref 1.5–4.5)
Glucose: 100 mg/dL — ABNORMAL HIGH (ref 70–99)
Potassium: 4.5 mmol/L (ref 3.5–5.2)
Sodium: 140 mmol/L (ref 134–144)
Total Protein: 6.1 g/dL (ref 6.0–8.5)
eGFR: 75 mL/min/{1.73_m2} (ref >59)

## 2023-04-22 LAB — LIPID PANEL W/O CHOL/HDL RATIO
Cholesterol, Total: 120 mg/dL (ref 100–199)
HDL: 47 mg/dL (ref >39)
LDL Chol Calc (NIH): 55 mg/dL (ref 0–99)
Triglycerides: 96 mg/dL (ref 0–149)
VLDL Cholesterol Cal: 18 mg/dL (ref 5–40)

## 2023-04-22 NOTE — Progress Notes (Signed)
Contacted via MyChart   Good afternoon Terell, your labs have returned: - Kidney function, creatinine and eGFR, remains normal, as is liver function, AST and ALT.  - Cholesterol levels are MUCH improved.  Continue current statin therapy as ordered.  It is offering benefit.  Any questions? Keep being amazing!!  Thank you for allowing me to participate in your care.  I appreciate you. Kindest regards, Exzavier Ruderman

## 2023-05-04 ENCOUNTER — Other Ambulatory Visit: Payer: Self-pay | Admitting: Nurse Practitioner

## 2023-05-06 NOTE — Telephone Encounter (Signed)
Requested Prescriptions  Pending Prescriptions Disp Refills   hydrochlorothiazide (HYDRODIURIL) 25 MG tablet [Pharmacy Med Name: hydroCHLOROthiazide 25 MG Oral Tablet] 90 tablet 0    Sig: TAKE ONE-HALF TABLET BY MOUTH  DAILY     Cardiovascular: Diuretics - Thiazide Passed - 05/04/2023  4:17 AM      Passed - Cr in normal range and within 180 days    Creatinine, Ser  Date Value Ref Range Status  04/21/2023 1.11 0.76 - 1.27 mg/dL Final         Passed - K in normal range and within 180 days    Potassium  Date Value Ref Range Status  04/21/2023 4.5 3.5 - 5.2 mmol/L Final         Passed - Na in normal range and within 180 days    Sodium  Date Value Ref Range Status  04/21/2023 140 134 - 144 mmol/L Final         Passed - Last BP in normal range    BP Readings from Last 1 Encounters:  04/21/23 121/71         Passed - Valid encounter within last 6 months    Recent Outpatient Visits           2 weeks ago Centrilobular emphysema (HCC)   [place] [facility] [place], Corrie Dandy T, NP   2 months ago Centrilobular emphysema (HCC)   [place] [facility] [employer], Corrie Dandy T, NP   8 months ago Centrilobular emphysema (HCC)   [place] [facility] [place], Corrie Dandy T, NP   1 year ago Centrilobular emphysema (HCC)   [place] [facility] [place], Corrie Dandy T, NP   1 year ago Pneumonia due to human metapneumovirus (hMPV)   [place] [facility] [place], Dorie Rank, NP       Future Appointments             In 3 months [place], Dorie Rank, NP [place] [facility]   In 4 months [place], Dorie Rank, NP [place] [facility]

## 2023-06-25 DIAGNOSIS — Z79899 Other long term (current) drug therapy: Secondary | ICD-10-CM | POA: Diagnosis not present

## 2023-06-25 DIAGNOSIS — M47819 Spondylosis without myelopathy or radiculopathy, site unspecified: Secondary | ICD-10-CM | POA: Diagnosis not present

## 2023-08-05 ENCOUNTER — Other Ambulatory Visit: Payer: Self-pay | Admitting: Nurse Practitioner

## 2023-08-07 NOTE — Telephone Encounter (Signed)
Requested Prescriptions  Pending Prescriptions Disp Refills   hydrochlorothiazide (HYDRODIURIL) 25 MG tablet [Pharmacy Med Name: hydroCHLOROthiazide 25 MG Oral Tablet] 50 tablet 0    Sig: TAKE ONE-HALF TABLET BY MOUTH  DAILY     Cardiovascular: Diuretics - Thiazide Passed - 08/05/2023  4:15 AM      Passed - Cr in normal range and within 180 days    Creatinine, Ser  Date Value Ref Range Status  04/21/2023 1.11 0.76 - 1.27 mg/dL Final         Passed - K in normal range and within 180 days    Potassium  Date Value Ref Range Status  04/21/2023 4.5 3.5 - 5.2 mmol/L Final         Passed - Na in normal range and within 180 days    Sodium  Date Value Ref Range Status  04/21/2023 140 134 - 144 mmol/L Final         Passed - Last BP in normal range    BP Readings from Last 1 Encounters:  04/21/23 121/71         Passed - Valid encounter within last 6 months    Recent Outpatient Visits           3 months ago Centrilobular emphysema (HCC)   [place] [facility] [place], Hope T, NP   5 months ago Centrilobular emphysema (HCC)   [place] [facility] [place], Gregory T, NP   11 months ago Centrilobular emphysema (HCC)   [place] [facility] [place], Corrie Dandy T, NP   1 year ago Centrilobular emphysema (HCC)   [place] [facility] [place], Corrie Dandy T, NP   1 year ago Pneumonia due to human metapneumovirus (hMPV)   [place] [facility] [place], Dorie Rank, NP       Future Appointments             In 3 weeks [place], Dorie Rank, NP [place] [facility]   In 1 month [place], Dorie Rank, NP [place] [facility]

## 2023-08-23 NOTE — Patient Instructions (Signed)
Prediabetes Eating Plan Prediabetes is a condition that causes blood sugar (glucose) levels to be higher than normal. This increases the risk for developing type 2 diabetes (type 2 diabetes mellitus). Working with a health care provider or nutrition specialist (dietitian) to make diet and lifestyle changes can help prevent the onset of diabetes. These changes may help you: Control your blood glucose levels. Improve your cholesterol levels. Manage your blood pressure. What are tips for following this plan? Reading food labels Read food labels to check the amount of fat, salt (sodium), and sugar in prepackaged foods. Avoid foods that have: Saturated fats. Trans fats. Added sugars. Avoid foods that have more than 300 milligrams (mg) of sodium per serving. Limit your sodium intake to less than 2,300 mg each day. Shopping Avoid buying pre-made and processed foods. Avoid buying drinks with added sugar. Cooking Cook with olive oil. Do not use butter, lard, or ghee. Bake, broil, grill, steam, or boil foods. Avoid frying. Meal planning  Work with your dietitian to create an eating plan that is right for you. This may include tracking how many calories you take in each day. Use a food diary, notebook, or mobile application to track what you eat at each meal. Consider following a Mediterranean diet. This includes: Eating several servings of fresh fruits and vegetables each day. Eating fish at least twice a week. Eating one serving each day of whole grains, beans, nuts, and seeds. Using olive oil instead of other fats. Limiting alcohol. Limiting red meat. Using nonfat or low-fat dairy products. Consider following a plant-based diet. This includes dietary choices that focus on eating mostly vegetables and fruit, grains, beans, nuts, and seeds. If you have high blood pressure, you may need to limit your sodium intake or follow a diet such as the DASH (Dietary Approaches to Stop Hypertension) eating  plan. The DASH diet aims to lower high blood pressure. Lifestyle Set weight loss goals with help from your health care team. It is recommended that most people with prediabetes lose 7% of their body weight. Exercise for at least 30 minutes 5 or more days a week. Attend a support group or seek support from a mental health counselor. Take over-the-counter and prescription medicines only as told by your health care provider. What foods are recommended? Fruits Berries. Bananas. Apples. Oranges. Grapes. Papaya. Mango. Pomegranate. Kiwi. Grapefruit. Cherries. Vegetables Lettuce. Spinach. Peas. Beets. Cauliflower. Cabbage. Broccoli. Carrots. Tomatoes. Squash. Eggplant. Herbs. Peppers. Onions. Cucumbers. Brussels sprouts. Grains Whole grains, such as whole-wheat or whole-grain breads, crackers, cereals, and pasta. Unsweetened oatmeal. Bulgur. Barley. Quinoa. Brown rice. Corn or whole-wheat flour tortillas or taco shells. Meats and other proteins Seafood. Poultry without skin. Lean cuts of pork and beef. Tofu. Eggs. Nuts. Beans. Dairy Low-fat or fat-free dairy products, such as yogurt, cottage cheese, and cheese. Beverages Water. Tea. Coffee. Sugar-free or diet soda. Seltzer water. Low-fat or nonfat milk. Milk alternatives, such as soy or almond milk. Fats and oils Olive oil. Canola oil. Sunflower oil. Grapeseed oil. Avocado. Walnuts. Sweets and desserts Sugar-free or low-fat pudding. Sugar-free or low-fat ice cream and other frozen treats. Seasonings and condiments Herbs. Sodium-free spices. Mustard. Relish. Low-salt, low-sugar ketchup. Low-salt, low-sugar barbecue sauce. Low-fat or fat-free mayonnaise. The items listed above may not be a complete list of recommended foods and beverages. Contact a dietitian for more information. What foods are not recommended? Fruits Fruits canned with syrup. Vegetables Canned vegetables. Frozen vegetables with butter or cream sauce. Grains Refined white  flour and flour   products, such as bread, pasta, snack foods, and cereals. Meats and other proteins Fatty cuts of meat. Poultry with skin. Breaded or fried meat. Processed meats. Dairy Full-fat yogurt, cheese, or milk. Beverages Sweetened drinks, such as iced tea and soda. Fats and oils Butter. Lard. Ghee. Sweets and desserts Baked goods, such as cake, cupcakes, pastries, cookies, and cheesecake. Seasonings and condiments Spice mixes with added salt. Ketchup. Barbecue sauce. Mayonnaise. The items listed above may not be a complete list of foods and beverages that are not recommended. Contact a dietitian for more information. Where to find more information American Diabetes Association: www.diabetes.org Summary You may need to make diet and lifestyle changes to help prevent the onset of diabetes. These changes can help you control blood sugar, improve cholesterol levels, and manage blood pressure. Set weight loss goals with help from your health care team. It is recommended that most people with prediabetes lose 7% of their body weight. Consider following a Mediterranean diet. This includes eating plenty of fresh fruits and vegetables, whole grains, beans, nuts, seeds, fish, and low-fat dairy, and using olive oil instead of other fats. This information is not intended to replace advice given to you by your health care provider. Make sure you discuss any questions you have with your health care provider. Document Revised: 11/18/2019 Document Reviewed: 11/18/2019 Elsevier Patient Education  2024 [employer] Inc.  

## 2023-08-28 ENCOUNTER — Ambulatory Visit (INDEPENDENT_AMBULATORY_CARE_PROVIDER_SITE_OTHER): Payer: Medicare Other | Admitting: Nurse Practitioner

## 2023-08-28 ENCOUNTER — Encounter: Payer: Self-pay | Admitting: Nurse Practitioner

## 2023-08-28 VITALS — BP 130/71 | HR 71 | Temp 97.9°F | Ht 70.0 in | Wt 222.2 lb

## 2023-08-28 DIAGNOSIS — I10 Essential (primary) hypertension: Secondary | ICD-10-CM | POA: Diagnosis not present

## 2023-08-28 DIAGNOSIS — E6609 Other obesity due to excess calories: Secondary | ICD-10-CM

## 2023-08-28 DIAGNOSIS — F17210 Nicotine dependence, cigarettes, uncomplicated: Secondary | ICD-10-CM

## 2023-08-28 DIAGNOSIS — J432 Centrilobular emphysema: Secondary | ICD-10-CM | POA: Diagnosis not present

## 2023-08-28 DIAGNOSIS — I700 Atherosclerosis of aorta: Secondary | ICD-10-CM

## 2023-08-28 DIAGNOSIS — R7309 Other abnormal glucose: Secondary | ICD-10-CM

## 2023-08-28 DIAGNOSIS — M1990 Unspecified osteoarthritis, unspecified site: Secondary | ICD-10-CM

## 2023-08-28 DIAGNOSIS — E7800 Pure hypercholesterolemia, unspecified: Secondary | ICD-10-CM

## 2023-08-28 DIAGNOSIS — E66811 Obesity, class 1: Secondary | ICD-10-CM

## 2023-08-28 DIAGNOSIS — Z23 Encounter for immunization: Secondary | ICD-10-CM

## 2023-08-28 DIAGNOSIS — Z6830 Body mass index (BMI) 30.0-30.9, adult: Secondary | ICD-10-CM

## 2023-08-28 LAB — BAYER DCA HB A1C WAIVED: HB A1C (BAYER DCA - WAIVED): 5.8 % — ABNORMAL HIGH (ref 4.8–5.6)

## 2023-08-28 MED ORDER — HYDROCHLOROTHIAZIDE 25 MG PO TABS: 12.5000 mg | ORAL_TABLET | Freq: Every day | ORAL | 4 refills | Status: DC

## 2023-08-28 MED ORDER — SILDENAFIL CITRATE 100 MG PO TABS
50.0000 mg | ORAL_TABLET | Freq: Every day | ORAL | 11 refills | Status: AC | PRN
Start: 2023-09-04 — End: ?

## 2023-08-28 NOTE — Progress Notes (Signed)
BP 130/71   Pulse 71   Temp 97.9 F (36.6 C) (Oral)   Ht 5\' 10"  (1.778 m)   Wt 222 lb 3.2 oz (100.8 kg)   SpO2 94%   BMI 31.88 kg/m    Subjective:    Patient ID: Hunter Huffman, male    DOB: Dec 17, 1960, 62 y.o.   MRN: [id number]  HPI: Hunter Huffman is a 62 y.o. male  Chief Complaint  Patient presents with   Diabetes   Hyperlipidemia   Hypertension   Erectile Dysfunction    Patient states he has been having trouble getting an erection, wonders if it from his simvastatin    HYPERTENSION/HYPERLIPIDEMIA Continues on HCTZ daily.  History of poor reaction with Atorvastatin.  Currently uses Crestor 10 MG 3 times weekly.  Is having a difficult time achieving and maintaining an erection recently, would like to try some medication for this. Hypertension status: stable  Satisfied with current treatment? yes Duration of hypertension: chronic BP monitoring frequency:  not checking BP range:  BP medication side effects:  no Medication compliance: good compliance Previous BP meds: Aspirin: no Recurrent headaches: no Visual changes: no Palpitations: no Dyspnea: no Chest pain: no Lower extremity edema: no Dizzy/lightheaded: no  The ASCVD Risk score (Arnett DK, et al., 2019) failed to calculate for the following reasons:   The valid total cholesterol range is 130 to 320 mg/dL  Impaired Fasting Glucose HbA1C:  Lab Results  Component Value Date   HGBA1C 6.1 (H) 02/24/2023  Duration of elevated blood sugar:  Polydipsia: no Polyuria: no Weight change: no Visual disturbance: no Glucose Monitoring: no    Accucheck frequency: TID    Fasting glucose:     Post prandial:  Diabetic Education: Not Completed Family history of diabetes: yes   COPD Using Breztri twice daily and Albuterol as needed.  Continues to smoke, smoking 1/2 PPD to 1 PPD cigarettes a day.  Has been smoking since teen years.  CT lung noted moderate centrilobular emphysema and aortic atherosclerosis, last  02/18/23. COPD status: stable Satisfied with current treatment?: yes Oxygen use: no Dyspnea frequency: occasional with activity Cough frequency: occasional Rescue inhaler frequency:  occasional Limitation of activity: no Productive cough: yes Last Spirometry: 08/28/23 Pneumovax: Up To Date Influenza: Up to Date   Relevant past medical, surgical, family and social history reviewed and updated as indicated. Interim medical history since our last visit reviewed. Allergies and medications reviewed and updated.  Review of Systems  Constitutional:  Negative for activity change, appetite change, diaphoresis, fatigue and unexpected weight change.  Respiratory:  Negative for cough, chest tightness, shortness of breath and wheezing.   Cardiovascular:  Negative for chest pain, palpitations and leg swelling.  Gastrointestinal: Negative.   Skin: Negative.   Neurological: Negative.   Psychiatric/Behavioral: Negative.      Per HPI unless specifically indicated above     Objective:    BP 130/71   Pulse 71   Temp 97.9 F (36.6 C) (Oral)   Ht 5\' 10"  (1.778 m)   Wt 222 lb 3.2 oz (100.8 kg)   SpO2 94%   BMI 31.88 kg/m   Wt Readings from Last 3 Encounters:  08/28/23 222 lb 3.2 oz (100.8 kg)  04/21/23 216 lb (98 kg)  02/24/23 214 lb 3.2 oz (97.2 kg)    Physical Exam Vitals and nursing note reviewed.  Constitutional:      General: He is awake. He is not in acute distress.    Appearance:  He is well-developed and well-groomed. He is obese. He is not ill-appearing or toxic-appearing.  HENT:     Head: Normocephalic and atraumatic.     Right Ear: Hearing normal. No drainage.     Left Ear: Hearing normal. No drainage.  Eyes:     General: Lids are normal.        Right eye: No discharge.        Left eye: No discharge.     Conjunctiva/sclera: Conjunctivae normal.     Pupils: Pupils are equal, round, and reactive to light.  Neck:     Thyroid: No thyromegaly.     Vascular: No carotid  bruit.  Cardiovascular:     Rate and Rhythm: Normal rate and regular rhythm.     Heart sounds: Normal heart sounds, S1 normal and S2 normal. No murmur heard.    No gallop.  Pulmonary:     Effort: Pulmonary effort is normal. No accessory muscle usage or respiratory distress.     Breath sounds: Normal breath sounds.  Abdominal:     General: Bowel sounds are normal. There is no distension.     Palpations: Abdomen is soft.     Tenderness: There is no abdominal tenderness.  Musculoskeletal:        General: Normal range of motion.     Cervical back: Normal range of motion and neck supple.     Right lower leg: No edema.     Left lower leg: No edema.  Lymphadenopathy:     Cervical: No cervical adenopathy.  Skin:    General: Skin is warm and dry.     Capillary Refill: Capillary refill takes less than 2 seconds.  Neurological:     Mental Status: He is alert and oriented to person, place, and time.     Deep Tendon Reflexes: Reflexes are normal and symmetric.  Psychiatric:        Mood and Affect: Mood normal.        Speech: Speech normal.        Behavior: Behavior normal. Behavior is cooperative.        Thought Content: Thought content normal.    Results for orders placed or performed in visit on 04/21/23  Comprehensive metabolic panel   Collection Time: 04/21/23 11:51 AM  Result Value Ref Range   Glucose 100 (H) 70 - 99 mg/dL   BUN 14 8 - 27 mg/dL   Creatinine, Ser 1.32 0.76 - 1.27 mg/dL   eGFR 75 >44 WN/UUV/2.53   BUN/Creatinine Ratio 13 10 - 24   Sodium 140 134 - 144 mmol/L   Potassium 4.5 3.5 - 5.2 mmol/L   Chloride 102 96 - 106 mmol/L   CO2 24 20 - 29 mmol/L   Calcium 9.5 8.6 - 10.2 mg/dL   Total Protein 6.1 6.0 - 8.5 g/dL   Albumin 4.2 3.9 - 4.9 g/dL   Globulin, Total 1.9 1.5 - 4.5 g/dL   Bilirubin Total 0.4 0.0 - 1.2 mg/dL   Alkaline Phosphatase 71 44 - 121 IU/L   AST 20 0 - 40 IU/L   ALT 16 0 - 44 IU/L  Lipid Panel w/o Chol/HDL Ratio   Collection Time: 04/21/23 11:51  AM  Result Value Ref Range   Cholesterol, Total 120 100 - 199 mg/dL   Triglycerides 96 0 - 149 mg/dL   HDL 47 >66 mg/dL   VLDL Cholesterol Cal 18 5 - 40 mg/dL   LDL Chol Calc (NIH) 55 0 - 99 mg/dL  Assessment & Plan:   Problem List Items Addressed This Visit       Cardiovascular and Mediastinum   Aortic atherosclerosis (HCC)   Chronic.  Noted on CT imaging 11/13/21.  Recommend complete cessation of smoking and continue statin therapy.      Relevant Medications   sildenafil (VIAGRA) 100 MG tablet (Start on 09/04/2023)   hydrochlorothiazide (HYDRODIURIL) 25 MG tablet   Other Relevant Orders   Lipid Panel w/o Chol/HDL Ratio   Hypertension   Chronic, stable.  BP at goal in office.  Recommend he monitor BP at least a few mornings a week at home and document.  DASH diet at home.  Continue current medication regimen and adjust as needed.  Labs today: BMP and lipid.  Urine ALB 10 February 2023.  Refills up to date.       Relevant Medications   sildenafil (VIAGRA) 100 MG tablet (Start on 09/04/2023)   hydrochlorothiazide (HYDRODIURIL) 25 MG tablet   Other Relevant Orders   Basic metabolic panel     Respiratory   Centrilobular emphysema (HCC) - Primary   Chronic, ongoing -- severe on recent CT imaging and spirometry moderate noted on check today.  Recommend complete cessation of smoking. Continue Breztri due to his symptom and stage of disease, benefits more from triple therapy.  Educated him on this inhaler and use + side effects.  Continue Albuterol as needed, especially before activity.  Educated him on importance of proper inhaler use due to progressive nature of disease.  Lung cancer screening annually, next 02/18/24.  Discussed that he would benefit from a visit with pulmonary for more testing and recommendations.  He refuses at this time.      Relevant Orders   Spirometry with Graph (Completed)     Musculoskeletal and Integument   Chronic inflammatory arthritis   Chronic, followed  with rheumatology at [facility], continue this collaboration and medications as ordered by them.      Relevant Orders   Basic metabolic panel     Other   Elevated hemoglobin A1c measurement   A1c is trending down to 5.8%, previous 6.1%.  Urine ALB 10 February 2023.  Recommend heavy focus on diet and regular exercise to prevent ongoing trend up in this level.  Discussed risks at length.      Relevant Orders   Bayer DCA Hb A1c Waived   Hypercholesteremia   Chronic, ongoing with history of poor tolerance to Atorvastatin.  Continue Rosuvastatin three days a week, which he is tolerating.  Lipid panel today.      Relevant Medications   sildenafil (VIAGRA) 100 MG tablet (Start on 09/04/2023)   hydrochlorothiazide (HYDRODIURIL) 25 MG tablet   Other Relevant Orders   Lipid Panel w/o Chol/HDL Ratio   Nicotine dependence, cigarettes, uncomplicated   I have recommended complete cessation of tobacco use. I have discussed various options available for assistance with tobacco cessation including over the counter methods (Nicotine gum, patch and lozenges). We also discussed prescription options (Chantix, Nicotine Inhaler / Nasal Spray). The patient is not interested in pursuing any prescription tobacco cessation options at this time.       Obesity   BMI 31.88.  Recommended eating smaller high protein, low fat meals more frequently and exercising 30 mins a day 5 times a week with a goal of 10-15lb weight loss in the next 3 months. Patient voiced their understanding and motivation to adhere to these recommendations.        Follow up plan:  Return in about 6 months (around 02/26/2024) for Annual Physical.

## 2023-08-28 NOTE — Assessment & Plan Note (Signed)
Chronic, ongoing with history of poor tolerance to Atorvastatin.  Continue Rosuvastatin three days a week, which he is tolerating.  Lipid panel today.

## 2023-08-28 NOTE — Assessment & Plan Note (Signed)
Chronic.  Noted on CT imaging 11/13/21.  Recommend complete cessation of smoking and continue statin therapy.

## 2023-08-28 NOTE — Assessment & Plan Note (Signed)
Chronic, stable.  BP at goal in office.  Recommend he monitor BP at least a few mornings a week at home and document.  DASH diet at home.  Continue current medication regimen and adjust as needed.  Labs today: BMP and lipid.  Urine ALB 10 February 2023.  Refills up to date.

## 2023-08-28 NOTE — Assessment & Plan Note (Signed)
I have recommended complete cessation of tobacco use. I have discussed various options available for assistance with tobacco cessation including over the counter methods (Nicotine gum, patch and lozenges). We also discussed prescription options (Chantix, Nicotine Inhaler / Nasal Spray). The patient is not interested in pursuing any prescription tobacco cessation options at this time.  

## 2023-08-28 NOTE — Assessment & Plan Note (Signed)
Chronic, followed with rheumatology at [facility], continue this collaboration and medications as ordered by them.

## 2023-08-28 NOTE — Assessment & Plan Note (Signed)
BMI 31.88.  Recommended eating smaller high protein, low fat meals more frequently and exercising 30 mins a day 5 times a week with a goal of 10-15lb weight loss in the next 3 months. Patient voiced their understanding and motivation to adhere to these recommendations.  

## 2023-08-28 NOTE — Assessment & Plan Note (Signed)
A1c is trending down to 5.8%, previous 6.1%.  Urine ALB 10 February 2023.  Recommend heavy focus on diet and regular exercise to prevent ongoing trend up in this level.  Discussed risks at length.

## 2023-08-28 NOTE — Assessment & Plan Note (Signed)
Chronic, ongoing -- severe on recent CT imaging and spirometry moderate noted on check today.  Recommend complete cessation of smoking. Continue Breztri due to his symptom and stage of disease, benefits more from triple therapy.  Educated him on this inhaler and use + side effects.  Continue Albuterol as needed, especially before activity.  Educated him on importance of proper inhaler use due to progressive nature of disease.  Lung cancer screening annually, next 02/18/24.  Discussed that he would benefit from a visit with pulmonary for more testing and recommendations.  He refuses at this time.

## 2023-08-29 LAB — BASIC METABOLIC PANEL
BUN/Creatinine Ratio: 13 (ref 10–24)
BUN: 16 mg/dL (ref 8–27)
CO2: 27 mmol/L (ref 20–29)
Calcium: 9.6 mg/dL (ref 8.6–10.2)
Chloride: 102 mmol/L (ref 96–106)
Creatinine, Ser: 1.21 mg/dL (ref 0.76–1.27)
Glucose: 110 mg/dL — ABNORMAL HIGH (ref 70–99)
Potassium: 4.5 mmol/L (ref 3.5–5.2)
Sodium: 141 mmol/L (ref 134–144)
eGFR: 67 mL/min/{1.73_m2} (ref >59)

## 2023-08-29 LAB — LIPID PANEL W/O CHOL/HDL RATIO
Cholesterol, Total: 127 mg/dL (ref 100–199)
HDL: 48 mg/dL (ref >39)
LDL Chol Calc (NIH): 64 mg/dL (ref 0–99)
Triglycerides: 73 mg/dL (ref 0–149)
VLDL Cholesterol Cal: 15 mg/dL (ref 5–40)

## 2023-08-29 NOTE — Progress Notes (Signed)
Contacted via MyChart   Good afternoon Hunter Huffman, your labs have returned and overall remain stable.  No changes needed.  Great news!!  Any questions? Keep being amazing!!  Thank you for allowing me to participate in your care.  I appreciate you. Kindest regards, Jaqueline Uber

## 2023-09-23 ENCOUNTER — Ambulatory Visit: Payer: Medicare Other | Admitting: Nurse Practitioner

## 2023-09-25 DIAGNOSIS — M47819 Spondylosis without myelopathy or radiculopathy, site unspecified: Secondary | ICD-10-CM | POA: Diagnosis not present

## 2023-11-05 ENCOUNTER — Encounter: Payer: Self-pay | Admitting: Nurse Practitioner

## 2023-11-05 ENCOUNTER — Ambulatory Visit: Admitting: Nurse Practitioner

## 2023-11-05 VITALS — BP 124/70 | HR 75 | Temp 97.4°F | Ht 70.0 in | Wt 213.8 lb

## 2023-11-05 DIAGNOSIS — J441 Chronic obstructive pulmonary disease with (acute) exacerbation: Secondary | ICD-10-CM | POA: Diagnosis not present

## 2023-11-05 MED ORDER — IPRATROPIUM-ALBUTEROL 0.5-2.5 (3) MG/3ML IN SOLN
3.0000 mL | Freq: Once | RESPIRATORY_TRACT | Status: AC
  Administered 2023-11-05: 3 mL via RESPIRATORY_TRACT

## 2023-11-05 MED ORDER — PREDNISONE 20 MG PO TABS: 40.0000 mg | ORAL_TABLET | Freq: Every day | ORAL | 0 refills | Status: DC

## 2023-11-05 MED ORDER — AMOXICILLIN-POT CLAVULANATE 875-125 MG PO TABS: 1.0000 | ORAL_TABLET | Freq: Two times a day (BID) | ORAL | 0 refills | Status: AC

## 2023-11-05 NOTE — Progress Notes (Signed)
 BP 124/70 (BP Location: Left Arm, Patient Position: Sitting, Cuff Size: Normal)   Pulse 75   Temp (!) 97.4 F (36.3 C) (Oral)   Ht 5\' 10"  (1.778 m)   Wt 213 lb 12.8 oz (97 kg)   SpO2 92%   BMI 30.68 kg/m    Subjective:    Patient ID: Hunter Huffman, male    DOB: 04-26-1961, 63 y.o.   MRN: [id number]  HPI: Hunter Huffman is a 63 y.o. male  Chief Complaint  Patient presents with   URI    Patient states that he has been having congestion, cough, SOB an fatigue since Friday. States he feels like everything has settled in his chest and he is having a hard time breathing.    UPPER RESPIRATORY TRACT INFECTION Started with symptoms 5 days ago -- sore throat and runny nose.  Now is in chest.  Has underlying COPD, is a smoker. Using inhalers as ordered.  No Covid or Flu testing at home. Fever: no Cough: yes Shortness of breath: yes Wheezing: yes Chest pain: no Chest tightness: yes Chest congestion: yes Nasal congestion: yes Runny nose: yes Post nasal drip: yes Sneezing: no Sore throat: mild Swollen glands: no Sinus pressure: no Headache: no Face pain: no Toothache: no Ear pain: yes bilateral Ear pressure: yes bilateral Eyes red/itching:no Eye drainage/crusting: no  Vomiting: no Rash: no Fatigue: yes Sick contacts: yes Strep contacts: no  Context: worse Recurrent sinusitis: no Relief with OTC cold/cough medications: yes  Treatments attempted: Robitussin and Sudafed    Relevant past medical, surgical, family and social history reviewed and updated as indicated. Interim medical history since our last visit reviewed. Allergies and medications reviewed and updated.  Review of Systems  Constitutional:  Positive for fatigue. Negative for activity change, appetite change, chills, diaphoresis and fever.  HENT:  Positive for congestion, ear pain, postnasal drip, rhinorrhea and sore throat. Negative for ear discharge, sinus pressure, sinus pain, sneezing and voice change.    Respiratory:  Positive for cough, chest tightness, shortness of breath and wheezing.   Cardiovascular:  Negative for chest pain, palpitations and leg swelling.  Gastrointestinal: Negative.   Neurological: Negative.   Psychiatric/Behavioral: Negative.      Per HPI unless specifically indicated above     Objective:    BP 124/70 (BP Location: Left Arm, Patient Position: Sitting, Cuff Size: Normal)   Pulse 75   Temp (!) 97.4 F (36.3 C) (Oral)   Ht 5\' 10"  (1.778 m)   Wt 213 lb 12.8 oz (97 kg)   SpO2 92%   BMI 30.68 kg/m   Wt Readings from Last 3 Encounters:  11/05/23 213 lb 12.8 oz (97 kg)  08/28/23 222 lb 3.2 oz (100.8 kg)  04/21/23 216 lb (98 kg)    Physical Exam Vitals and nursing note reviewed.  Constitutional:      General: He is awake. He is not in acute distress.    Appearance: He is well-developed and well-groomed. He is obese. He is not ill-appearing or toxic-appearing.  HENT:     Head: Normocephalic.     Right Ear: Hearing, ear canal and external ear normal. No tenderness. A middle ear effusion is present. Tympanic membrane is not injected or perforated.     Left Ear: Hearing, ear canal and external ear normal. No tenderness. A middle ear effusion is present. Tympanic membrane is not injected or perforated.     Nose: Rhinorrhea present. Rhinorrhea is clear.  Right Sinus: No maxillary sinus tenderness or frontal sinus tenderness.     Left Sinus: No maxillary sinus tenderness or frontal sinus tenderness.     Mouth/Throat:     Mouth: Mucous membranes are moist.     Pharynx: Posterior oropharyngeal erythema (mild) present. No pharyngeal swelling or oropharyngeal exudate.  Eyes:     General: Lids are normal.     Extraocular Movements: Extraocular movements intact.     Conjunctiva/sclera: Conjunctivae normal.  Neck:     Thyroid: No thyromegaly.     Vascular: No carotid bruit.  Cardiovascular:     Rate and Rhythm: Normal rate and regular rhythm.     Heart  sounds: Normal heart sounds.  Pulmonary:     Effort: No accessory muscle usage or respiratory distress.     Breath sounds: Decreased air movement present. Wheezing present. No decreased breath sounds or rales.     Comments: Expiratory wheezes noted throughout. No SOB with talking. Improved lung sounds after Duoneb. Abdominal:     General: Bowel sounds are normal. There is no distension.     Palpations: Abdomen is soft.     Tenderness: There is no abdominal tenderness.  Musculoskeletal:     Cervical back: Full passive range of motion without pain.     Right lower leg: No edema.     Left lower leg: No edema.  Lymphadenopathy:     Head:     Right side of head: No submental, submandibular, tonsillar, preauricular or posterior auricular adenopathy.     Left side of head: No submental, submandibular, tonsillar, preauricular or posterior auricular adenopathy.     Cervical: No cervical adenopathy.  Skin:    General: Skin is warm.     Capillary Refill: Capillary refill takes less than 2 seconds.  Neurological:     Mental Status: He is alert and oriented to person, place, and time.     Deep Tendon Reflexes: Reflexes are normal and symmetric.     Reflex Scores:      Brachioradialis reflexes are 2+ on the right side and 2+ on the left side.      Patellar reflexes are 2+ on the right side and 2+ on the left side. Psychiatric:        Attention and Perception: Attention normal.        Mood and Affect: Mood normal.        Speech: Speech normal.        Behavior: Behavior normal. Behavior is cooperative.        Thought Content: Thought content normal.    Results for orders placed or performed in visit on 08/28/23  Bayer DCA Hb A1c Waived   Collection Time: 08/28/23  8:36 AM  Result Value Ref Range   HB A1C (BAYER DCA - WAIVED) 5.8 (H) 4.8 - 5.6 %  Basic metabolic panel   Collection Time: 08/28/23  8:36 AM  Result Value Ref Range   Glucose 110 (H) 70 - 99 mg/dL   BUN 16 8 - 27 mg/dL    Creatinine, Ser 1.61 0.76 - 1.27 mg/dL   eGFR 67 >09 UE/AVW/0.98   BUN/Creatinine Ratio 13 10 - 24   Sodium 141 134 - 144 mmol/L   Potassium 4.5 3.5 - 5.2 mmol/L   Chloride 102 96 - 106 mmol/L   CO2 27 20 - 29 mmol/L   Calcium 9.6 8.6 - 10.2 mg/dL  Lipid Panel w/o Chol/HDL Ratio   Collection Time: 08/28/23  8:36 AM  Result Value Ref Range   Cholesterol, Total 127 100 - 199 mg/dL   Triglycerides 73 0 - 149 mg/dL   HDL 48 >82 mg/dL   VLDL Cholesterol Cal 15 5 - 40 mg/dL   LDL Chol Calc (NIH) 64 0 - 99 mg/dL      Assessment & Plan:   Problem List Items Addressed This Visit       Respiratory   COPD with acute exacerbation (HCC) - Primary   Acute for 5 days with no improvement.  Highly recommend complete cessation of smoking.  Start Augmentin BID for 10 days and Prednisone 40 MG daily for 5 days.  Cough syrups, prescription wise, do not appear to be covered.  Continue OTC cough relief.  Will have return in one week for lung check, if ongoing obtain chest imaging.  Recommend: - Increased rest - Increasing Fluids - Acetaminophen as needed for fever/pain.  - Salt water gargling, chloraseptic spray and throat lozenges - OTC Coricidin - Mucinex.  - Humidifying the air.       Relevant Medications   ipratropium-albuterol (DUONEB) 0.5-2.5 (3) MG/3ML nebulizer solution 3 mL (Start on 11/05/2023  5:00 PM)   predniSONE (DELTASONE) 20 MG tablet     Follow up plan: Return in about 1 week (around 11/12/2023) for COPD EXACERBATION.

## 2023-11-05 NOTE — Assessment & Plan Note (Addendum)
 Acute for 5 days with no improvement.  Highly recommend complete cessation of smoking.  Duoneb in office today.  Start Augmentin BID for 10 days and Prednisone 40 MG daily for 5 days.  Cough syrups, prescription wise, do not appear to be covered.  Continue OTC cough relief.  Will have return in one week for lung check, if ongoing obtain chest imaging.  Recommend: - Increased rest - Increasing Fluids - Acetaminophen as needed for fever/pain.  - Salt water gargling, chloraseptic spray and throat lozenges - OTC Coricidin - Mucinex.  - Humidifying the air.

## 2023-11-05 NOTE — Patient Instructions (Signed)
 COPD Exacerbation This video will teach you what types of triggers can make COPD worse, and how to avoid them. To view the content, go to this web address: [email or web address]  This video will expire on: 08/13/2025. If you need access to this video following this date, please reach out to the healthcare provider who assigned it to you. This information is not intended to replace advice given to you by your health care provider. Make sure you discuss any questions you have with your health care provider. Elsevier Patient Education  2024 [employer].

## 2023-11-09 NOTE — Patient Instructions (Signed)
 COPD Exacerbation This video will teach you what types of triggers can make COPD worse, and how to avoid them. To view the content, go to this web address: [email or web address]  This video will expire on: 08/13/2025. If you need access to this video following this date, please reach out to the healthcare provider who assigned it to you. This information is not intended to replace advice given to you by your health care provider. Make sure you discuss any questions you have with your health care provider. Elsevier Patient Education  2024 [employer].

## 2023-11-12 ENCOUNTER — Ambulatory Visit (INDEPENDENT_AMBULATORY_CARE_PROVIDER_SITE_OTHER): Admitting: Nurse Practitioner

## 2023-11-12 ENCOUNTER — Encounter: Payer: Self-pay | Admitting: Nurse Practitioner

## 2023-11-12 ENCOUNTER — Ambulatory Visit
Admission: RE | Admit: 2023-11-12 | Discharge: 2023-11-12 | Disposition: A | Discharge status: Home / Self Care | Source: Home / Self Care | Attending: Nurse Practitioner | Admitting: Nurse Practitioner

## 2023-11-12 ENCOUNTER — Ambulatory Visit
Admission: RE | Admit: 2023-11-12 | Discharge: 2023-11-12 | Disposition: A | Discharge status: Home / Self Care | Source: Ambulatory Visit | Attending: Nurse Practitioner | Admitting: Nurse Practitioner

## 2023-11-12 VITALS — BP 105/67 | HR 70 | Temp 97.5°F | Ht 70.0 in | Wt 213.8 lb

## 2023-11-12 DIAGNOSIS — J441 Chronic obstructive pulmonary disease with (acute) exacerbation: Secondary | ICD-10-CM | POA: Insufficient documentation

## 2023-11-12 DIAGNOSIS — J439 Emphysema, unspecified: Secondary | ICD-10-CM | POA: Diagnosis not present

## 2023-11-12 DIAGNOSIS — R918 Other nonspecific abnormal finding of lung field: Secondary | ICD-10-CM | POA: Diagnosis not present

## 2023-11-12 MED ORDER — PREDNISONE 20 MG PO TABS: 40.0000 mg | ORAL_TABLET | Freq: Every day | ORAL | 0 refills | Status: AC

## 2023-11-12 NOTE — Assessment & Plan Note (Signed)
 Acute and slowly improving, but not 100%.  Highly recommend complete cessation of smoking.  Continue Augmentin until complete and will do 3 more days of Prednisone.  Continue OTC cough relief.  Obtain chest imaging since ongoing cough and SOB.  Will have return in one week for lung check.  Recommend: - Increased rest - Increasing Fluids - Acetaminophen as needed for fever/pain.  - Salt water gargling, chloraseptic spray and throat lozenges - OTC Coricidin - Mucinex.  - Humidifying the air.

## 2023-11-12 NOTE — Progress Notes (Signed)
 BP 105/67   Pulse 70   Temp (!) 97.5 F (36.4 C) (Oral)   Ht 5\' 10"  (1.778 m)   Wt 213 lb 12.8 oz (97 kg)   SpO2 94%   BMI 30.68 kg/m    Subjective:    Patient ID: Hunter Huffman, male    DOB: [birth year], 63 y.o.   MRN: [id number]  HPI: Hunter Huffman is a 63 y.o. male  Chief Complaint  Patient presents with   COPD   COPD Treated on 11/05/23 with Augmentin and Prednisone for exacerbation.  Still has 2 1/2 days of Augmentin. Tolerating medication well.  Has been taking it easy and resting. States he is feeling a lot better. Continues to have a little bit of cough.  Continues to use Breztri BID and Albuterol as needed.  Alpha 1 Antitrypsin negative in 2023.    Currently not smoking, but before was sick smoked 1 pack or less. COPD status: exacerbated Satisfied with current treatment?: yes Oxygen use: no Dyspnea frequency: with walking continues to have  Cough frequency: a little bit Rescue inhaler frequency: a little more with exacerbation Limitation of activity: a little bit Productive cough: yes is coughing it up Last Spirometry: December 2024 Pneumovax: Up to Date Influenza: Up to Date  Relevant past medical, surgical, family and social history reviewed and updated as indicated. Interim medical history since our last visit reviewed. Allergies and medications reviewed and updated.  Review of Systems  Constitutional:  Negative for activity change, appetite change, chills, diaphoresis, fatigue and fever.  HENT: Negative.    Respiratory:  Positive for cough (improving), shortness of breath (improving) and wheezing (improving). Negative for chest tightness.   Cardiovascular:  Negative for chest pain, palpitations and leg swelling.  Gastrointestinal: Negative.   Neurological: Negative.   Psychiatric/Behavioral: Negative.      Per HPI unless specifically indicated above     Objective:    BP 105/67   Pulse 70   Temp (!) 97.5 F (36.4 C) (Oral)   Ht 5\' 10"  (1.778  m)   Wt 213 lb 12.8 oz (97 kg)   SpO2 94%   BMI 30.68 kg/m   Wt Readings from Last 3 Encounters:  11/12/23 213 lb 12.8 oz (97 kg)  11/05/23 213 lb 12.8 oz (97 kg)  08/28/23 222 lb 3.2 oz (100.8 kg)    Physical Exam Vitals and nursing note reviewed.  Constitutional:      General: He is awake. He is not in acute distress.    Appearance: He is well-developed and well-groomed. He is obese. He is not ill-appearing or toxic-appearing.  HENT:     Head: Normocephalic.     Right Ear: Hearing and external ear normal.     Left Ear: Hearing and external ear normal.  Eyes:     General: Lids are normal.     Extraocular Movements: Extraocular movements intact.     Conjunctiva/sclera: Conjunctivae normal.  Neck:     Thyroid: No thyromegaly.     Vascular: No carotid bruit.  Cardiovascular:     Rate and Rhythm: Normal rate and regular rhythm.     Heart sounds: Normal heart sounds. No murmur heard.    No gallop.  Pulmonary:     Effort: No accessory muscle usage or respiratory distress.     Breath sounds: Wheezing present. No decreased breath sounds, rhonchi or rales.     Comments: Occasional expiratory wheezes noted throughout. Abdominal:     General: Bowel sounds  are normal. There is no distension.     Palpations: Abdomen is soft.     Tenderness: There is no abdominal tenderness.  Musculoskeletal:     Cervical back: Full passive range of motion without pain.     Right lower leg: No edema.     Left lower leg: No edema.  Lymphadenopathy:     Cervical: No cervical adenopathy.  Skin:    General: Skin is warm.     Capillary Refill: Capillary refill takes less than 2 seconds.  Neurological:     Mental Status: He is alert and oriented to person, place, and time.     Deep Tendon Reflexes: Reflexes are normal and symmetric.     Reflex Scores:      Brachioradialis reflexes are 2+ on the right side and 2+ on the left side.      Patellar reflexes are 2+ on the right side and 2+ on the left  side. Psychiatric:        Attention and Perception: Attention normal.        Mood and Affect: Mood normal.        Speech: Speech normal.        Behavior: Behavior normal. Behavior is cooperative.        Thought Content: Thought content normal.     Results for orders placed or performed in visit on 08/28/23  Bayer DCA Hb A1c Waived   Collection Time: 08/28/23  8:36 AM  Result Value Ref Range   HB A1C (BAYER DCA - WAIVED) 5.8 (H) 4.8 - 5.6 %  Basic metabolic panel   Collection Time: 08/28/23  8:36 AM  Result Value Ref Range   Glucose 110 (H) 70 - 99 mg/dL   BUN 16 8 - 27 mg/dL   Creatinine, Ser 8.29 0.76 - 1.27 mg/dL   eGFR 67 >56 OZ/HYQ/6.57   BUN/Creatinine Ratio 13 10 - 24   Sodium 141 134 - 144 mmol/L   Potassium 4.5 3.5 - 5.2 mmol/L   Chloride 102 96 - 106 mmol/L   CO2 27 20 - 29 mmol/L   Calcium 9.6 8.6 - 10.2 mg/dL  Lipid Panel w/o Chol/HDL Ratio   Collection Time: 08/28/23  8:36 AM  Result Value Ref Range   Cholesterol, Total 127 100 - 199 mg/dL   Triglycerides 73 0 - 149 mg/dL   HDL 48 >84 mg/dL   VLDL Cholesterol Cal 15 5 - 40 mg/dL   LDL Chol Calc (NIH) 64 0 - 99 mg/dL      Assessment & Plan:   Problem List Items Addressed This Visit       Respiratory   COPD with acute exacerbation (HCC) - Primary   Acute and slowly improving, but not 100%.  Highly recommend complete cessation of smoking.  Continue Augmentin until complete and will do 3 more days of Prednisone.  Continue OTC cough relief.  Obtain chest imaging since ongoing cough and SOB.  Will have return in one week for lung check.  Recommend: - Increased rest - Increasing Fluids - Acetaminophen as needed for fever/pain.  - Salt water gargling, chloraseptic spray and throat lozenges - OTC Coricidin - Mucinex.  - Humidifying the air.       Relevant Medications   predniSONE (DELTASONE) 20 MG tablet   Other Relevant Orders   DG Chest 2 View     Follow up plan: Return in about 1 week (around  11/19/2023) for COPD.

## 2023-11-13 ENCOUNTER — Other Ambulatory Visit: Payer: Self-pay | Admitting: Nurse Practitioner

## 2023-11-13 ENCOUNTER — Encounter: Payer: Self-pay | Admitting: Nurse Practitioner

## 2023-11-13 MED ORDER — AZITHROMYCIN 250 MG PO TABS: ORAL_TABLET | ORAL | 0 refills | Status: AC

## 2023-11-13 NOTE — Progress Notes (Signed)
 Contacted via MyChart   Good morning Hunter Huffman, your imaging has returned.  I have read report and viewed images.  It does look like you have a little pneumonia present.  I am going to send in Azithromycin as we discussed, for second antibiotic.  Complete your Augmentin and take this antibiotic as well.  Any questions? Keep being amazing!!  Thank you for allowing me to participate in your care.  I appreciate you. Kindest regards, Dimond Crotty

## 2023-11-19 ENCOUNTER — Ambulatory Visit (INDEPENDENT_AMBULATORY_CARE_PROVIDER_SITE_OTHER): Admitting: Nurse Practitioner

## 2023-11-19 ENCOUNTER — Encounter: Payer: Self-pay | Admitting: Nurse Practitioner

## 2023-11-19 VITALS — BP 134/76 | HR 71 | Temp 98.4°F | Ht 70.0 in | Wt 212.8 lb

## 2023-11-19 DIAGNOSIS — J441 Chronic obstructive pulmonary disease with (acute) exacerbation: Secondary | ICD-10-CM | POA: Diagnosis not present

## 2023-11-19 MED ORDER — HYDROCODONE BIT-HOMATROP MBR 5-1.5 MG/5ML PO SOLN: 5.0000 mL | Freq: Four times a day (QID) | ORAL | 0 refills | Status: DC | PRN

## 2023-11-19 NOTE — Progress Notes (Signed)
 BP 134/76   Pulse 71   Temp 98.4 F (36.9 C) (Oral)   Ht 5\' 10"  (1.778 m)   Wt 212 lb 12.8 oz (96.5 kg)   SpO2 92%   BMI 30.53 kg/m    Subjective:    Patient ID: Hunter Huffman, male    DOB: 07/17/1961, 63 y.o.   MRN: [id number]  HPI: Hunter Huffman is a 63 y.o. male  Chief Complaint  Patient presents with   COPD   COPD Treated on 11/05/23 with Augmentin and Prednisone for exacerbation.  Added 3 more days of Prednisone on 11/12/23.  Imaging which did show minimal infiltrate at lingula, added on Azithromycin.  Reports he is feeling better. Uses Breztri BID and Albuterol as needed.  Alpha 1 Antitrypsin negative in 2023.    Has smoked two cigarettes in past 3 days. COPD status: exacerbated improving Satisfied with current treatment?: yes Oxygen use: no Dyspnea frequency: if exerts self  Cough frequency: a little bit Rescue inhaler frequency: with exacerbation used more Limitation of activity: a little bit Productive cough: no Last Spirometry: December 2024 Pneumovax: Up to Date Influenza: Up to Date  Relevant past medical, surgical, family and social history reviewed and updated as indicated. Interim medical history since our last visit reviewed. Allergies and medications reviewed and updated.  Review of Systems  Constitutional:  Negative for activity change, appetite change, chills, diaphoresis, fatigue and fever.  HENT: Negative.    Respiratory:  Positive for cough (improving), shortness of breath (improving) and wheezing (improving). Negative for chest tightness.   Cardiovascular:  Negative for chest pain, palpitations and leg swelling.  Gastrointestinal: Negative.   Neurological: Negative.   Psychiatric/Behavioral: Negative.     Per HPI unless specifically indicated above     Objective:    BP 134/76   Pulse 71   Temp 98.4 F (36.9 C) (Oral)   Ht 5\' 10"  (1.778 m)   Wt 212 lb 12.8 oz (96.5 kg)   SpO2 92%   BMI 30.53 kg/m   Wt Readings from Last 3  Encounters:  11/19/23 212 lb 12.8 oz (96.5 kg)  11/12/23 213 lb 12.8 oz (97 kg)  11/05/23 213 lb 12.8 oz (97 kg)    Physical Exam Vitals and nursing note reviewed.  Constitutional:      General: He is awake. He is not in acute distress.    Appearance: He is well-developed and well-groomed. He is obese. He is not ill-appearing or toxic-appearing.  HENT:     Head: Normocephalic.     Right Ear: Hearing and external ear normal.     Left Ear: Hearing and external ear normal.  Eyes:     General: Lids are normal.     Extraocular Movements: Extraocular movements intact.     Conjunctiva/sclera: Conjunctivae normal.  Neck:     Thyroid: No thyromegaly.     Vascular: No carotid bruit.  Cardiovascular:     Rate and Rhythm: Normal rate and regular rhythm.     Heart sounds: Normal heart sounds. No murmur heard.    No gallop.  Pulmonary:     Effort: No accessory muscle usage or respiratory distress.     Breath sounds: Wheezing present. No decreased breath sounds, rhonchi or rales.     Comments: Occasional expiratory wheezes noted throughout.  Improved air movement this check. Abdominal:     General: Bowel sounds are normal. There is no distension.     Palpations: Abdomen is soft.  Tenderness: There is no abdominal tenderness.  Musculoskeletal:     Cervical back: Full passive range of motion without pain.     Right lower leg: No edema.     Left lower leg: No edema.  Lymphadenopathy:     Cervical: No cervical adenopathy.  Skin:    General: Skin is warm.     Capillary Refill: Capillary refill takes less than 2 seconds.  Neurological:     Mental Status: He is alert and oriented to person, place, and time.     Deep Tendon Reflexes: Reflexes are normal and symmetric.     Reflex Scores:      Brachioradialis reflexes are 2+ on the right side and 2+ on the left side.      Patellar reflexes are 2+ on the right side and 2+ on the left side. Psychiatric:        Attention and Perception:  Attention normal.        Mood and Affect: Mood normal.        Speech: Speech normal.        Behavior: Behavior normal. Behavior is cooperative.        Thought Content: Thought content normal.    Results for orders placed or performed in visit on 08/28/23  Bayer DCA Hb A1c Waived   Collection Time: 08/28/23  8:36 AM  Result Value Ref Range   HB A1C (BAYER DCA - WAIVED) 5.8 (H) 4.8 - 5.6 %  Basic metabolic panel   Collection Time: 08/28/23  8:36 AM  Result Value Ref Range   Glucose 110 (H) 70 - 99 mg/dL   BUN 16 8 - 27 mg/dL   Creatinine, Ser 1.61 0.76 - 1.27 mg/dL   eGFR 67 >09 UE/AVW/0.98   BUN/Creatinine Ratio 13 10 - 24   Sodium 141 134 - 144 mmol/L   Potassium 4.5 3.5 - 5.2 mmol/L   Chloride 102 96 - 106 mmol/L   CO2 27 20 - 29 mmol/L   Calcium 9.6 8.6 - 10.2 mg/dL  Lipid Panel w/o Chol/HDL Ratio   Collection Time: 08/28/23  8:36 AM  Result Value Ref Range   Cholesterol, Total 127 100 - 199 mg/dL   Triglycerides 73 0 - 149 mg/dL   HDL 48 >11 mg/dL   VLDL Cholesterol Cal 15 5 - 40 mg/dL   LDL Chol Calc (NIH) 64 0 - 99 mg/dL      Assessment & Plan:   Problem List Items Addressed This Visit       Respiratory   COPD with acute exacerbation (HCC) - Primary   Acute and improving.  Possible mild PNA noted on imaging, has completed all treatment.  Highly recommend complete cessation of smoking. Sent in Hydromet for cough. Recommend: - Increased rest - Increasing Fluids - Acetaminophen as needed for fever/pain.  - Salt water gargling, chloraseptic spray and throat lozenges - OTC Coricidin - Mucinex.  - Humidifying the air.  - Return in 6 weeks, may need pulmonary if SOB continues.      Relevant Medications   HYDROcodone bit-homatropine (HYDROMET) 5-1.5 MG/5ML syrup      Follow up plan: Return in about 6 weeks (around 12/31/2023) for Pneumonia.

## 2023-11-19 NOTE — Patient Instructions (Signed)
 COPD Exacerbation This video will teach you what types of triggers can make COPD worse, and how to avoid them. To view the content, go to this web address: [email or web address]  This video will expire on: 08/13/2025. If you need access to this video following this date, please reach out to the healthcare provider who assigned it to you. This information is not intended to replace advice given to you by your health care provider. Make sure you discuss any questions you have with your health care provider. Elsevier Patient Education  2024 [employer].

## 2023-11-19 NOTE — Assessment & Plan Note (Signed)
 Acute and improving.  Possible mild PNA noted on imaging, has completed all treatment.  Highly recommend complete cessation of smoking. Sent in Hydromet for cough. Recommend: - Increased rest - Increasing Fluids - Acetaminophen as needed for fever/pain.  - Salt water gargling, chloraseptic spray and throat lozenges - OTC Coricidin - Mucinex.  - Humidifying the air.  - Return in 6 weeks, may need pulmonary if SOB continues.

## 2023-12-25 ENCOUNTER — Ambulatory Visit (INDEPENDENT_AMBULATORY_CARE_PROVIDER_SITE_OTHER): Payer: Self-pay | Admitting: Emergency Medicine

## 2023-12-25 VITALS — Ht 72.0 in | Wt 218.0 lb

## 2023-12-25 DIAGNOSIS — Z0000 Encounter for general adult medical examination without abnormal findings: Secondary | ICD-10-CM

## 2023-12-25 NOTE — Progress Notes (Signed)
 Subjective:   Hunter Huffman is a 63 y.o. who presents for a Medicare Wellness preventive visit.  Visit Complete: Virtual I connected with  Sajjad T Lundeen on 12/25/23 by a audio enabled telemedicine application and verified that I am speaking with the correct person using two identifiers.  Patient Location: Home  Provider Location: Home Office  I discussed the limitations of evaluation and management by telemedicine. The patient expressed understanding and agreed to proceed.  Vital Signs: Because this visit was a virtual/telehealth visit, some criteria may be missing or patient reported. Any vitals not documented were not able to be obtained and vitals that have been documented are patient reported.  VideoDeclined- This patient declined Librarian, academic. Therefore the visit was completed with audio only.  Persons Participating in Visit: Patient.  AWV Questionnaire: No: Patient Medicare AWV questionnaire was not completed prior to this visit.  Cardiac Risk Factors include: advanced age (>76men, >45 women);male gender;hypertension;dyslipidemia;smoking/ tobacco exposure     Objective:    Today's Vitals   12/25/23 1347  Weight: 218 lb (98.9 kg)  Height: 6' (1.829 m)  PainSc: 5    Body mass index is 29.57 kg/m.     12/25/2023    2:08 PM 12/16/2022    2:34 PM 12/26/2021    7:47 AM 12/25/2021    7:59 AM 09/18/2021    8:32 AM  Advanced Directives  Does Patient Have a Medical Advance Directive? No No No No No  Would patient like information on creating a medical advance directive? No - Patient declined No - Patient declined No - Patient declined No - Patient declined No - Patient declined    Current Medications (verified) Outpatient Encounter Medications as of 12/25/2023  Medication Sig   acetaminophen  (TYLENOL ) 500 MG tablet Take 1,000 mg by mouth every 6 (six) hours as needed.   albuterol  (PROVENTIL  HFA) 108 (90 Base) MCG/ACT inhaler Inhale 2  puffs into the lungs every 6 (six) hours as needed for wheezing or shortness of breath.   Budeson-Glycopyrrol-Formoterol (BREZTRI  AEROSPHERE) 160-9-4.8 MCG/ACT AERO Inhale 2 puffs into the lungs 2 (two) times daily.   ENBREL SURECLICK 50 MG/ML injection Inject 50 mg into the skin once a week.    hydrochlorothiazide  (HYDRODIURIL ) 25 MG tablet Take 0.5 tablets (12.5 mg total) by mouth daily.   ibuprofen  (ADVIL ) 200 MG tablet Take 400 mg by mouth every 6 (six) hours as needed.   Multiple Vitamin (MULTIVITAMIN ADULT PO) Take 1 tablet by mouth daily. Vitafusion   naproxen (NAPROSYN) 500 MG tablet Take 500 mg by mouth 2 (two) times daily.   omeprazole (PRILOSEC) 20 MG capsule Take 20 mg by mouth daily.   rosuvastatin  (CRESTOR ) 10 MG tablet Take 1 tablet (10 mg total) by mouth 3 (three) times a week.   sildenafil  (VIAGRA ) 100 MG tablet Take 0.5-1 tablets (50-100 mg total) by mouth daily as needed for erectile dysfunction.   vitamin C (ASCORBIC ACID) 250 MG tablet Take 2 tablets by mouth daily.   HYDROcodone  bit-homatropine (HYDROMET) 5-1.5 MG/5ML syrup Take 5 mLs by mouth every 6 (six) hours as needed for cough. (Patient not taking: Reported on 12/25/2023)   No facility-administered encounter medications on file as of 12/25/2023.    Allergies (verified) Patient has no known allergies.   History: Past Medical History:  Diagnosis Date   Hypertension    Inflammatory arthritis    Past Surgical History:  Procedure Laterality Date   HEMORRHOID SURGERY     Family  History  Problem Relation Age of Onset   Diabetes Mother    Diabetes Father    Hypertension Father    Arthritis Father    Diabetes Brother    Social History   Socioeconomic History   Marital status: Significant Other    Spouse name: Not on file   Number of children: 0   Years of education: Not on file   Highest education level: Associate degree: occupational, Scientist, product/process development, or vocational program  Occupational History   Occupation:  disability  Tobacco Use   Smoking status: Every Day    Current packs/day: 0.50    Average packs/day: 0.5 packs/day for 47.3 years (23.7 ttl pk-yrs)    Types: Cigarettes    Start date: 47   Smokeless tobacco: Never  Vaping Use   Vaping status: Never Used  Substance and Sexual Activity   Alcohol use: Yes    Comment: very rarely - 1 beer   Drug use: No   Sexual activity: Yes  Other Topics Concern   Not on file  Social History Narrative   Not on file   Social Drivers of Health   Financial Resource Strain: Low Risk  (12/25/2023)   Overall Financial Resource Strain (CARDIA)    Difficulty of Paying Living Expenses: Not hard at all  Food Insecurity: No Food Insecurity (12/25/2023)   Hunger Vital Sign    Worried About Running Out of Food in the Last Year: Never true    Ran Out of Food in the Last Year: Never true  Transportation Needs: No Transportation Needs (12/25/2023)   PRAPARE - Administrator, Civil Service (Medical): No    Lack of Transportation (Non-Medical): No  Physical Activity: Insufficiently Active (12/25/2023)   Exercise Vital Sign    Days of Exercise per Week: 6 days    Minutes of Exercise per Session: 10 min  Stress: No Stress Concern Present (12/25/2023)   [employer] of Occupational Health - Occupational Stress Questionnaire    Feeling of Stress : Not at all  Social Connections: Moderately Isolated (12/25/2023)   Social Connection and Isolation Panel [NHANES]    Frequency of Communication with Friends and Family: More than three times a week    Frequency of Social Gatherings with Friends and Family: Twice a week    Attends Religious Services: Never    Database administrator or Organizations: No    Attends Engineer, structural: Never    Marital Status: Living with partner    Tobacco Counseling Ready to quit: Not Answered Counseling given: Not Answered    Clinical Intake:  Pre-visit preparation completed: Yes  Pain : 0-10 Pain  Score: 5  Pain Type: Chronic pain Pain Location: Ankle Pain Orientation: Right Pain Descriptors / Indicators: Aching     BMI - recorded: 29.57 Nutritional Status: BMI 25 -29 Overweight Nutritional Risks: None Diabetes: No  Lab Results  Component Value Date   HGBA1C 5.8 (H) 08/28/2023   HGBA1C 6.1 (H) 02/24/2023   HGBA1C 6.0 (H) 08/23/2022     How often do you need to have someone help you when you read instructions, pamphlets, or other written materials from your doctor or pharmacy?: 1 - Never  Interpreter Needed?: No  Information entered by :: Jaunita Messier, CMA   Activities of Daily Living     12/25/2023    1:49 PM  In your present state of health, do you have any difficulty performing the following activities:  Hearing? 0  Vision? 0  Difficulty concentrating or making decisions? 0  Walking or climbing stairs? 1  Dressing or bathing? 0  Doing errands, shopping? 0  Preparing Food and eating ? N  Using the Toilet? N  In the past six months, have you accidently leaked urine? N  Do you have problems with loss of bowel control? N  Managing your Medications? N  Managing your Finances? N  Housekeeping or managing your Housekeeping? N    Patient Care Team: Cannady, Jolene T, NP as PCP - General (Nurse Practitioner) Bryna Car, [facility] (Optometry) Defoor, Herb Loges, PA-C (Rheumatology)  Indicate any recent Medical Services you may have received from other than [facility] providers in the past year (date may be approximate).     Assessment:   This is a routine wellness examination for Chay.  Hearing/Vision screen Hearing Screening - Comments:: Denies hearing loss Vision Screening - Comments:: Needs eye exam, Dr Violet Grew [place]   Goals Addressed             This Visit's Progress    Quit Smoking         Depression Screen     12/25/2023    2:06 PM 08/28/2023    8:46 AM 02/24/2023    8:23 AM 12/16/2022    2:33 PM 08/23/2022    9:35 AM 02/21/2022    10:53 AM 01/04/2022    2:18 PM  PHQ 2/9 Scores  PHQ - 2 Score 0 0 0 0 0 0 0  PHQ- 9 Score 0 0 0 0 0 0 0    Fall Risk     12/25/2023    2:09 PM 08/28/2023    8:46 AM 02/24/2023    8:23 AM 12/16/2022    2:35 PM 08/23/2022    9:35 AM  Fall Risk   Falls in the past year? 0 0 0 0 0  Number falls in past yr: 0 0 0 0 0  Injury with Fall? 0 0 0 0 0  Risk for fall due to : No Fall Risks No Fall Risks No Fall Risks No Fall Risks No Fall Risks  Follow up Falls prevention discussed;Falls evaluation completed Falls evaluation completed Falls evaluation completed Falls prevention discussed;Falls evaluation completed Falls evaluation completed    MEDICARE RISK AT HOME:  Medicare Risk at Home Any stairs in or around the home?: Yes If so, are there any without handrails?: No Home free of loose throw rugs in walkways, pet beds, electrical cords, etc?: Yes Adequate lighting in your home to reduce risk of falls?: Yes Life alert?: No Use of a cane, walker or w/c?: No Grab bars in the bathroom?: Yes Shower chair or bench in shower?: Yes Elevated toilet seat or a handicapped toilet?: Yes  TIMED UP AND GO:  Was the test performed?  No  Cognitive Function: 6CIT completed        12/25/2023    2:10 PM 12/16/2022    2:40 PM  6CIT Screen  What Year? 0 points 0 points  What month? 0 points 0 points  What time? 0 points 0 points  Count back from 20 0 points 0 points  Months in reverse 0 points 0 points  Repeat phrase 0 points 0 points  Total Score 0 points 0 points    Immunizations Immunization History  Administered Date(s) Administered   Influenza,inj,Quad PF,6+ Mos 07/22/2017, 06/29/2018   Influenza-Unspecified 07/01/2016, 06/21/2020, 08/05/2021, 06/03/2022, 06/25/2023   PFIZER Comirnaty(Gray Top)Covid-19 Tri-Sucrose Vaccine 10/07/2020   PFIZER(Purple Top)SARS-COV-2 Vaccination 11/09/2019,  12/07/2019   Pneumococcal Polysaccharide-23 09/05/2021   Td 06/20/2008, 10/05/2018    Screening  Tests Health Maintenance  Topic Date Due   Zoster Vaccines- Shingrix  (1 of 2) Never done   Colonoscopy  Never done   Pneumococcal Vaccine 62-22 Years old (2 of 2 - PCV) 09/05/2022   COVID-19 Vaccine (4 - 2024-25 season) 05/04/2023   Lung Cancer Screening  02/18/2024   INFLUENZA VACCINE  04/02/2024   Medicare Annual Wellness (AWV)  12/24/2024   DTaP/Tdap/Td (3 - Tdap) 10/05/2028   Hepatitis C Screening  Completed   HIV Screening  Completed   HPV VACCINES  Aged Out   Meningococcal B Vaccine  Aged Out    Health Maintenance  Health Maintenance Due  Topic Date Due   Zoster Vaccines- Shingrix  (1 of 2) Never done   Colonoscopy  Never done   Pneumococcal Vaccine 29-12 Years old (2 of 2 - PCV) 09/05/2022   COVID-19 Vaccine (4 - 2024-25 season) 05/04/2023   Health Maintenance Items Addressed: See Nurse Notes  Additional Screening:  Vision Screening: Recommended annual ophthalmology exams for early detection of glaucoma and other disorders of the eye.  Dental Screening: Recommended annual dental exams for proper oral hygiene  Community Resource Referral / Chronic Care Management: CRR required this visit?  No   CCM required this visit?  No     Plan:     I have personally reviewed and noted the following in the patient's chart:   Medical and social history Use of alcohol, tobacco or illicit drugs  Current medications and supplements including opioid prescriptions. Patient is not currently taking opioid prescriptions. Functional ability and status Nutritional status Physical activity Advanced directives List of other physicians Hospitalizations, surgeries, and ER visits in previous 12 months Vitals Screenings to include cognitive, depression, and falls Referrals and appointments  In addition, I have reviewed and discussed with patient certain preventive protocols, quality metrics, and best practice recommendations. A written personalized care plan for preventive services  as well as general preventive health recommendations were provided to patient.     Jaunita Messier, CMA   12/25/2023   After Visit Summary: (MyChart) Due to this being a telephonic visit, the after visit summary with patients personalized plan was offered to patient via MyChart   Notes:  LDCT due ~02/19/24 (order already placed) Gave patient ph# to call and schedule. Declined covid and singles vaccines Declined colonosocpy and cologuard Needs routine eye exam with Dr. Alto Atta

## 2023-12-25 NOTE — Patient Instructions (Signed)
 Mr. Esson , Thank you for taking time to come for your Medicare Wellness Visit. I appreciate your ongoing commitment to your health goals. Please review the following plan we discussed and let me know if I can assist you in the future.   Referrals/Orders/Follow-Ups/Clinician Recommendations: You will be due for a low dose lung CT scan to screen for lung cancer on 02/19/24. Call [facility] @ [phone] to schedule at your convenience.  This is a list of the screening recommended for you and due dates:  Health Maintenance  Topic Date Due   Zoster (Shingles) Vaccine (1 of 2) Never done   Colon Cancer Screening  Never done   Pneumococcal Vaccination (2 of 2 - PCV) 09/05/2022   COVID-19 Vaccine (4 - 2024-25 season) 05/04/2023   Screening for Lung Cancer  02/18/2024   Flu Shot  04/02/2024   Medicare Annual Wellness Visit  12/24/2024   DTaP/Tdap/Td vaccine (3 - Tdap) 10/05/2028   Hepatitis C Screening  Completed   HIV Screening  Completed   HPV Vaccine  Aged Out   Meningitis B Vaccine  Aged Out    Advanced directives: (Declined) Advance directive discussed with you today. Even though you declined this today, please call our office should you change your mind, and we can give you the proper paperwork for you to fill out.  Next Medicare Annual Wellness Visit scheduled for next year: Yes, 01/06/25 @ 8:00am (phone visit)

## 2023-12-28 NOTE — Patient Instructions (Signed)
 Be Involved in Caring For Your Health:  Taking Medications When medications are taken as directed, they can greatly improve your health. But if they are not taken as prescribed, they may not work. In some cases, not taking them correctly can be harmful. To help ensure your treatment remains effective and safe, understand your medications and how to take them. Bring your medications to each visit for review by your provider.  Your lab results, notes, and after visit summary will be available on My Chart. We strongly encourage you to use this feature. If lab results are abnormal the clinic will contact you with the appropriate steps. If the clinic does not contact you assume the results are satisfactory. You can always view your results on My Chart. If you have questions regarding your health or results, please contact the clinic during office hours. You can also ask questions on My Chart.  We at [facility] are grateful that you chose [place] to provide your care. We strive to provide evidence-based and compassionate care and are always looking for feedback. If you get a survey from the clinic please complete this so we can hear your opinions.  Community-Acquired Pneumonia, Adult Pneumonia is an infection of the lungs. It causes irritation and swelling in the airways of the lungs. Mucus and fluid may also build up inside the airways. This may cause coughing and trouble breathing. One type of pneumonia can happen while you are in a hospital. A different type can happen when you are not in a hospital (community-acquired pneumonia). What are the causes?  This condition is caused by germs (viruses, bacteria, or fungi). Some types of germs can spread from person to person. Pneumonia is not thought to spread from person to person. What increases the risk? You have a long-term (chronic) disease, such as: Disease of the lungs. This may be chronic obstructive pulmonary disease (COPD) or asthma. Heart  failure. Cystic fibrosis. Diabetes. Kidney disease. Sickle cell disease. HIV. You have other health problems, such as: Your body's defense system (immune system) is weak. A condition that may cause you to breathe in fluids from your mouth and nose. You had your spleen taken out. You do not take good care of your teeth and mouth (poor dental hygiene). You use or have used tobacco products. You go where the germs that cause this illness are common. You are older than 64 years of age. What are the signs or symptoms? A cough. A fever. Sweating or chills. Chest pain, often when you breathe deeply or cough. Breathing problems, such as: Fast breathing. Trouble breathing. Shortness of breath. Feeling tired (fatigued). Muscle aches. How is this treated? Treatment for this condition depends on many things, such as: The cause of your illness. Your medicines. Your other health problems. Most adults can be treated at home. Sometimes, treatment must happen in a hospital. Treatment may include medicines to kill germs. Medicines may depend on which germ caused your illness. Very bad pneumonia is rare. If you get it, you may: Have a machine to help you breathe. Have fluid taken away from around your lungs. Follow these instructions at home: Medicines Take over-the-counter and prescription medicines only as told by your doctor. Take cough medicine only if you are losing sleep. Cough medicine can keep your body from taking mucus away from your lungs. If you were prescribed antibiotics, take them as told by your doctor. Do not stop taking them even if you start to feel better. Lifestyle  Do not smoke or use any products that contain nicotine or tobacco. If you need help quitting, ask your doctor. Do not drink alcohol. Eat a healthy diet. This includes a lot of vegetables, fruits, whole grains, low-fat dairy products, and low-fat (lean) protein. General instructions  Rest a lot. Sleep  for at least 8 hours each night. Sleep with your head and neck raised. Put a few pillows under your head or sleep in a reclining chair. Return to your normal activities as told by your doctor. Ask your doctor what activities are safe for you. Drink enough fluid to keep your pee (urine) pale yellow. If your throat is sore, gargle with a mixture of salt and water 3-4 times a day or as needed. To make salt water, completely dissolve -1 tsp (3-6 g) of salt in 1 cup (237 mL) of warm water. Keep all follow-up visits. How is this prevented? Getting the pneumonia shot (vaccine). These shots have different types and schedules. Ask your doctor what works best for you. Think about getting this shot if: You are older than 64 years of age. You are 25-63 years of age and: You are being treated for cancer. You have long-term lung disease. You have other problems that affect your body's defense system. Ask your doctor if you have one of these. Getting your flu shot every year. Ask your doctor which type of shot is best for you. Going to the dentist as often as told. Washing your hands often with soap and water for at least 20 seconds. If you cannot use soap and water, use hand sanitizer. Contact a doctor if: You have a fever. You lose sleep because your cough medicine does not help. Get help right away if: You are short of breath and this gets worse. You have more chest pain. Your sickness gets worse. This is very serious if: You are an older adult. Your body's defense system is weak. You cough up blood. These symptoms may be an emergency. Get help right away. Call 911. Do not wait to see if the symptoms will go away. Do not drive yourself to the hospital. Summary Pneumonia is an infection of the lungs. Community-acquired pneumonia affects people who have not been in the hospital. Certain germs can cause this infection. This condition may be treated with medicines that kill germs. For very bad  pneumonia, you may need a hospital stay and treatment to help with breathing. This information is not intended to replace advice given to you by your health care provider. Make sure you discuss any questions you have with your health care provider. Document Revised: 10/17/2021 Document Reviewed: 10/17/2021 Elsevier Patient Education  2024 [employer].

## 2023-12-31 ENCOUNTER — Ambulatory Visit
Admission: RE | Admit: 2023-12-31 | Discharge: 2023-12-31 | Disposition: A | Discharge status: Home / Self Care | Source: Ambulatory Visit | Attending: Nurse Practitioner | Admitting: Nurse Practitioner

## 2023-12-31 ENCOUNTER — Ambulatory Visit
Admission: RE | Admit: 2023-12-31 | Discharge: 2023-12-31 | Disposition: A | Discharge status: Home / Self Care | Attending: Nurse Practitioner | Admitting: Nurse Practitioner

## 2023-12-31 ENCOUNTER — Encounter: Payer: Self-pay | Admitting: Nurse Practitioner

## 2023-12-31 ENCOUNTER — Ambulatory Visit (INDEPENDENT_AMBULATORY_CARE_PROVIDER_SITE_OTHER): Admitting: Nurse Practitioner

## 2023-12-31 VITALS — BP 137/62 | HR 67 | Temp 98.8°F | Ht 70.0 in | Wt 209.0 lb

## 2023-12-31 DIAGNOSIS — J441 Chronic obstructive pulmonary disease with (acute) exacerbation: Secondary | ICD-10-CM | POA: Insufficient documentation

## 2023-12-31 DIAGNOSIS — J189 Pneumonia, unspecified organism: Secondary | ICD-10-CM

## 2023-12-31 DIAGNOSIS — J439 Emphysema, unspecified: Secondary | ICD-10-CM | POA: Diagnosis not present

## 2023-12-31 NOTE — Assessment & Plan Note (Signed)
 Acute and improved.  Treatment completed.  Has been 6 weeks, will recheck imaging to check on PNA.  Recommend: - Increased rest - Increasing Fluids

## 2023-12-31 NOTE — Progress Notes (Signed)
 BP 137/62   Pulse 67   Temp 98.8 F (37.1 C) (Oral)   Ht 5\' 10"  (1.778 m)   Wt 209 lb (94.8 kg)   SpO2 98%   BMI 29.99 kg/m    Subjective:    Patient ID: Hunter Huffman, male    DOB: 12/09/1960, 63 y.o.   MRN: [id number]  HPI: Hunter Huffman is a 63 y.o. male  Chief Complaint  Patient presents with   COPD   Pneumonia   COPD Treated on 11/05/23 with Augmentin  and Prednisone  for exacerbation.  Added 3 more days of Prednisone  on 11/12/23 + added Zpack due to infiltrate on imaging.  Reports he is feeling 100% better. Uses Breztri  BID and Albuterol  as needed.  Alpha 1 Antitrypsin negative in 2023.  Continues to smoke, has cut back to 10 cigarettes a day. COPD status: exacerbated improving Satisfied with current treatment?: yes Oxygen use: no Dyspnea frequency: no Cough frequency: no Rescue inhaler frequency: less now that is feeling better Limitation of activity: no Productive cough: no Last Spirometry: December 2024 Pneumovax: Up to Date Influenza: Up to Date  Relevant past medical, surgical, family and social history reviewed and updated as indicated. Interim medical history since our last visit reviewed. Allergies and medications reviewed and updated.  Review of Systems  Constitutional:  Negative for activity change, appetite change, diaphoresis, fatigue and unexpected weight change.  Respiratory:  Negative for cough, chest tightness, shortness of breath and wheezing.   Cardiovascular:  Negative for chest pain, palpitations and leg swelling.  Gastrointestinal: Negative.   Skin: Negative.   Neurological: Negative.   Psychiatric/Behavioral: Negative.      Per HPI unless specifically indicated above     Objective:    BP 137/62   Pulse 67   Temp 98.8 F (37.1 C) (Oral)   Ht 5\' 10"  (1.778 m)   Wt 209 lb (94.8 kg)   SpO2 98%   BMI 29.99 kg/m   Wt Readings from Last 3 Encounters:  12/31/23 209 lb (94.8 kg)  12/25/23 218 lb (98.9 kg)  11/19/23 212 lb 12.8 oz  (96.5 kg)    Physical Exam Vitals and nursing note reviewed.  Constitutional:      General: He is awake. He is not in acute distress.    Appearance: He is well-developed and well-groomed. He is obese. He is not ill-appearing or toxic-appearing.  HENT:     Head: Normocephalic and atraumatic.     Right Ear: Hearing normal. No drainage.     Left Ear: Hearing normal. No drainage.  Eyes:     General: Lids are normal.        Right eye: No discharge.        Left eye: No discharge.     Conjunctiva/sclera: Conjunctivae normal.     Pupils: Pupils are equal, round, and reactive to light.  Neck:     Thyroid : No thyromegaly.     Vascular: No carotid bruit.  Cardiovascular:     Rate and Rhythm: Normal rate and regular rhythm.     Heart sounds: Normal heart sounds, S1 normal and S2 normal. No murmur heard.    No gallop.  Pulmonary:     Effort: Pulmonary effort is normal. No accessory muscle usage or respiratory distress.     Breath sounds: Normal breath sounds. No decreased breath sounds, wheezing or rales.  Abdominal:     General: Bowel sounds are normal. There is no distension.     Palpations: Abdomen  is soft.     Tenderness: There is no abdominal tenderness.  Musculoskeletal:        General: Normal range of motion.     Cervical back: Normal range of motion and neck supple.     Right lower leg: No edema.     Left lower leg: No edema.  Lymphadenopathy:     Cervical: No cervical adenopathy.  Skin:    General: Skin is warm and dry.     Capillary Refill: Capillary refill takes less than 2 seconds.  Neurological:     Mental Status: He is alert and oriented to person, place, and time.     Deep Tendon Reflexes: Reflexes are normal and symmetric.  Psychiatric:        Mood and Affect: Mood normal.        Speech: Speech normal.        Behavior: Behavior normal. Behavior is cooperative.        Thought Content: Thought content normal.     Results for orders placed or performed in visit on  08/28/23  Bayer DCA Hb A1c Waived   Collection Time: 08/28/23  8:36 AM  Result Value Ref Range   HB A1C (BAYER DCA - WAIVED) 5.8 (H) 4.8 - 5.6 %  Basic metabolic panel   Collection Time: 08/28/23  8:36 AM  Result Value Ref Range   Glucose 110 (H) 70 - 99 mg/dL   BUN 16 8 - 27 mg/dL   Creatinine, Ser 2.95 0.76 - 1.27 mg/dL   eGFR 67 >18 AC/ZYS/0.63   BUN/Creatinine Ratio 13 10 - 24   Sodium 141 134 - 144 mmol/L   Potassium 4.5 3.5 - 5.2 mmol/L   Chloride 102 96 - 106 mmol/L   CO2 27 20 - 29 mmol/L   Calcium  9.6 8.6 - 10.2 mg/dL  Lipid Panel w/o Chol/HDL Ratio   Collection Time: 08/28/23  8:36 AM  Result Value Ref Range   Cholesterol, Total 127 100 - 199 mg/dL   Triglycerides 73 0 - 149 mg/dL   HDL 48 >01 mg/dL   VLDL Cholesterol Cal 15 5 - 40 mg/dL   LDL Chol Calc (NIH) 64 0 - 99 mg/dL      Assessment & Plan:   Problem List Items Addressed This Visit       Respiratory   COPD with acute exacerbation (HCC) - Primary   Acute and improved.  Treatment completed.  Has been 6 weeks, will recheck imaging to check on PNA.  Recommend: - Increased rest - Increasing Fluids       Relevant Orders   DG Chest 2 View   Other Visit Diagnoses       Pneumonia of both lower lobes due to infectious organism       Acute and improved.  Treatment completed.  Has been 6 weeks, will recheck imaging to check on PNA.  Recommend: - Increased rest - Increasing Fluids   Relevant Orders   DG Chest 2 View        Follow up plan: Return for as scheduled in June.

## 2024-01-02 ENCOUNTER — Encounter: Payer: Self-pay | Admitting: Nurse Practitioner

## 2024-01-22 DIAGNOSIS — Z79899 Other long term (current) drug therapy: Secondary | ICD-10-CM | POA: Diagnosis not present

## 2024-01-22 DIAGNOSIS — M47819 Spondylosis without myelopathy or radiculopathy, site unspecified: Secondary | ICD-10-CM | POA: Diagnosis not present

## 2024-02-18 ENCOUNTER — Ambulatory Visit
Admission: RE | Admit: 2024-02-18 | Discharge: 2024-02-18 | Disposition: A | Discharge status: Home / Self Care | Source: Ambulatory Visit | Attending: Acute Care | Admitting: Acute Care

## 2024-02-18 DIAGNOSIS — F17210 Nicotine dependence, cigarettes, uncomplicated: Secondary | ICD-10-CM | POA: Insufficient documentation

## 2024-02-18 DIAGNOSIS — Z87891 Personal history of nicotine dependence: Secondary | ICD-10-CM | POA: Insufficient documentation

## 2024-02-19 ENCOUNTER — Ambulatory Visit

## 2024-03-02 ENCOUNTER — Other Ambulatory Visit: Payer: Self-pay

## 2024-03-02 ENCOUNTER — Encounter: Payer: Self-pay | Admitting: Nurse Practitioner

## 2024-03-02 DIAGNOSIS — Z87891 Personal history of nicotine dependence: Secondary | ICD-10-CM

## 2024-03-02 DIAGNOSIS — F17210 Nicotine dependence, cigarettes, uncomplicated: Secondary | ICD-10-CM

## 2024-03-06 NOTE — Patient Instructions (Signed)
 Phentermine, Orlistat, Contrave, Metformin  Be Involved in Caring For Your Health:  Taking Medications When medications are taken as directed, they can greatly improve your health. But if they are not taken as prescribed, they may not work. In some cases, not taking them correctly can be harmful. To help ensure your treatment remains effective and safe, understand your medications and how to take them. Bring your medications to each visit for review by your provider.  Your lab results, notes, and after visit summary will be available on My Chart. We strongly encourage you to use this feature. If lab results are abnormal the clinic will contact you with the appropriate steps. If the clinic does not contact you assume the results are satisfactory. You can always view your results on My Chart. If you have questions regarding your health or results, please contact the clinic during office hours. You can also ask questions on My Chart.  We at [facility] are grateful that you chose us  to provide your care. We strive to provide evidence-based and compassionate care and are always looking for feedback. If you get a survey from the clinic please complete this so we can hear your opinions.  COPD and Physical Activity Chronic obstructive pulmonary disease (COPD) is a long-term, or chronic, condition that affects the lungs. COPD is a general term that can be used to describe many problems that cause inflammation of the lungs and limit airflow. These conditions include chronic bronchitis and emphysema. The main symptom of COPD is shortness of breath, which makes it harder to do even simple tasks. This can also make it harder to exercise and stay active. Talk with your health care provider about treatments to help you breathe better and actions you can take to prevent breathing problems during physical activity. What are the benefits of exercising when you have COPD? Exercising regularly is an  important part of a healthy lifestyle. You can still exercise and do physical activities even though you have COPD. Exercise and physical activity improve your shortness of breath by increasing blood flow (circulation). This causes your heart to pump more oxygen through your body. Moderate exercise can: Improve oxygen use. Increase your energy level. Help with shortness of breath. Strengthen your breathing muscles. Improve heart health. Help with sleep. Improve your self-esteem and feelings of self-worth. Lower depression, stress, and anxiety. Exercise can benefit everyone with COPD. The severity of your disease may affect how hard you can exercise, especially at first, but everyone can benefit. Talk with your health care provider about how much exercise is safe for you, and which activities and exercises are safe for you. What actions can I take to prevent breathing problems during physical activity? Sign up for a pulmonary rehabilitation program. This type of program may include: Education about lung diseases. Exercise classes that teach you how to exercise and be more active while improving your breathing. This usually involves: Exercise using your lower extremities, such as a stationary bicycle. About 30 minutes of exercise, 2 to 5 times per week, for 6 to 12 weeks. Strength training, such as push-ups or leg lifts. Nutrition education. Group classes in which you can talk with others who also have COPD and learn ways to manage stress. If you use an oxygen tank, you should use it while you exercise. Work with your health care provider to adjust your oxygen for your physical activity. Your resting flow rate is different from your flow rate during physical activity. How to manage your  breathing while exercising While you are exercising: Take slow breaths. Pace yourself, and do nottry to go too fast. Purse your lips while breathing out. Pursing your lips is similar to a kissing or whistling  position. If doing exercise that uses a quick burst of effort, such as weight lifting: Breathe in before starting the exercise. Breathe out during the hardest part of the exercise, such as raising the weights. Where to find support You can find support for exercising with COPD from: Your health care provider. A pulmonary rehabilitation program. Your local health department or community health programs. Support groups, either online or in-person. Your health care provider may be able to recommend support groups. Where to find more information You can find more information about exercising with COPD from: American Lung Association: lung.org COPD Foundation: copdfoundation.org Contact a health care provider if: Your symptoms get worse. You have nausea. You have a fever. You want to start a new exercise program or a new activity. Get help right away if: You have chest pain. You cannot breathe. These symptoms may represent a serious problem that is an emergency. Do not wait to see if the symptoms will go away. Get medical help right away. Call your local emergency services (911 in the [place] Do not drive yourself to the hospital. Summary COPD is a general term that can be used to describe many different lung problems that cause lung inflammation and limit airflow. This includes chronic bronchitis and emphysema. Exercise and physical activity improve your shortness of breath by increasing blood flow (circulation). This causes your heart to provide more oxygen to your body. Contact your health care provider before starting any exercise program or new activity. Ask your health care provider what exercises and activities are safe for you. This information is not intended to replace advice given to you by your health care provider. Make sure you discuss any questions you have with your health care provider. Document Revised: 07/04/2023 Document Reviewed: 07/04/2023 Elsevier Patient Education   2024 [employer].

## 2024-03-09 ENCOUNTER — Ambulatory Visit (INDEPENDENT_AMBULATORY_CARE_PROVIDER_SITE_OTHER): Admitting: Nurse Practitioner

## 2024-03-09 ENCOUNTER — Encounter: Payer: Self-pay | Admitting: Nurse Practitioner

## 2024-03-09 VITALS — BP 134/71 | HR 56 | Temp 98.0°F | Resp 16 | Ht 70.0 in | Wt 210.2 lb

## 2024-03-09 DIAGNOSIS — M1990 Unspecified osteoarthritis, unspecified site: Secondary | ICD-10-CM

## 2024-03-09 DIAGNOSIS — I700 Atherosclerosis of aorta: Secondary | ICD-10-CM

## 2024-03-09 DIAGNOSIS — E7800 Pure hypercholesterolemia, unspecified: Secondary | ICD-10-CM

## 2024-03-09 DIAGNOSIS — J432 Centrilobular emphysema: Secondary | ICD-10-CM | POA: Diagnosis not present

## 2024-03-09 DIAGNOSIS — Z0000 Encounter for general adult medical examination without abnormal findings: Secondary | ICD-10-CM

## 2024-03-09 DIAGNOSIS — M8589 Other specified disorders of bone density and structure, multiple sites: Secondary | ICD-10-CM | POA: Diagnosis not present

## 2024-03-09 DIAGNOSIS — K219 Gastro-esophageal reflux disease without esophagitis: Secondary | ICD-10-CM

## 2024-03-09 DIAGNOSIS — N400 Enlarged prostate without lower urinary tract symptoms: Secondary | ICD-10-CM

## 2024-03-09 DIAGNOSIS — R7309 Other abnormal glucose: Secondary | ICD-10-CM | POA: Diagnosis not present

## 2024-03-09 DIAGNOSIS — I10 Essential (primary) hypertension: Secondary | ICD-10-CM

## 2024-03-09 DIAGNOSIS — E66811 Obesity, class 1: Secondary | ICD-10-CM

## 2024-03-09 DIAGNOSIS — F17210 Nicotine dependence, cigarettes, uncomplicated: Secondary | ICD-10-CM | POA: Diagnosis not present

## 2024-03-09 LAB — MICROALBUMIN, URINE WAIVED
Creatinine, Urine Waived: 50 mg/dL (ref 10–300)
Microalb, Ur Waived: 10 mg/L (ref 0–19)
Microalb/Creat Ratio: <30 mg/g (ref <30)

## 2024-03-09 LAB — BAYER DCA HB A1C WAIVED: HB A1C (BAYER DCA - WAIVED): 6 % — ABNORMAL HIGH (ref 4.8–5.6)

## 2024-03-09 MED ORDER — BREZTRI AEROSPHERE 160-9-4.8 MCG/ACT IN AERO: 2.0000 | INHALATION_SPRAY | Freq: Two times a day (BID) | RESPIRATORY_TRACT | 11 refills | Status: AC

## 2024-03-09 MED ORDER — ALBUTEROL SULFATE HFA 108 (90 BASE) MCG/ACT IN AERS: 2.0000 | INHALATION_SPRAY | Freq: Four times a day (QID) | RESPIRATORY_TRACT | 4 refills | Status: AC | PRN

## 2024-03-09 MED ORDER — ROSUVASTATIN CALCIUM 10 MG PO TABS: 10.0000 mg | ORAL_TABLET | ORAL | 4 refills | Status: AC

## 2024-03-09 MED ORDER — HYDROCHLOROTHIAZIDE 25 MG PO TABS: 12.5000 mg | ORAL_TABLET | Freq: Every day | ORAL | 4 refills | Status: AC

## 2024-03-09 NOTE — Assessment & Plan Note (Signed)
BMI 30.16.  Recommended eating smaller high protein, low fat meals more frequently and exercising 30 mins a day 5 times a week with a goal of 10-15lb weight loss in the next 3 months. Patient voiced their understanding and motivation to adhere to these recommendations.  

## 2024-03-09 NOTE — Assessment & Plan Note (Signed)
Chronic, ongoing with history of poor tolerance to Atorvastatin.  Continue Rosuvastatin three days a week, which he is tolerating.  Lipid panel today.

## 2024-03-09 NOTE — Assessment & Plan Note (Signed)
Chronic, followed with rheumatology at [facility], continue this collaboration and medications as ordered by them.

## 2024-03-09 NOTE — Assessment & Plan Note (Signed)
 Chronic, stable.  BP at goal in office.  Recommend he monitor BP at least a few mornings a week at home and document.  DASH diet at home.  Continue current medication regimen and adjust as needed.  Labs today: CBC, CMP, TSH, urine ALB.  Urine ALB 11 March 2024.  Refills sent.

## 2024-03-09 NOTE — Progress Notes (Signed)
 BP 134/71 (BP Location: Left Arm, Patient Position: Sitting, Cuff Size: Normal)   Pulse (!) 56   Temp 98 F (36.7 C) (Oral)   Resp 16   Ht 5' 10 (1.778 m)   Wt 210 lb 3.2 oz (95.3 kg)   SpO2 92%   BMI 30.16 kg/m    Subjective:    Patient ID: Hunter Huffman, male    DOB: 1961-05-13, 63 y.o.   MRN: [id number]  HPI: Hunter Huffman is a 63 y.o. male presenting on 03/09/2024 for comprehensive medical examination. Current medical complaints include:none  He currently lives with: significant other Interim Problems from his last visit: no  HYPERTENSION/HYPERLIPIDEMIA Takes HCTZ and Rosuvastatin .  History of poor reaction with Atorvastatin , but tolerates Rosuvastatin  3 days a week. Osteopenia noted on imaging 02/18/23, to be working on exercise and Vitamin D  daily. Hypertension status: stable  Satisfied with current treatment? yes Duration of hypertension: chronic BP monitoring frequency:  not checking BP range:  BP medication side effects:  no Medication compliance: good compliance Previous BP meds: Aspirin: no Recurrent headaches: no Visual changes: no Palpitations: no Dyspnea: at baseline no worsening Chest pain: no Lower extremity edema: no Dizzy/lightheaded: no   Impaired Fasting Glucose HbA1C:  Lab Results  Component Value Date   HGBA1C 5.8 (H) 08/28/2023  Duration of elevated blood sugar:  Polydipsia: no Polyuria: no Weight change: no Visual disturbance: no Glucose Monitoring: no    Accucheck frequency: TID    Fasting glucose:     Post prandial:  Diabetic Education: Not Completed Family history of diabetes: yes    COPD Using Breztri  twice daily and Albuterol  as needed. Last exacerbation 11/05/23. Continues to smoke, smoking 1/2 PPD to 1 PPD cigarettes a day.  Has been smoking since teen years.  CT lung noted moderate centrilobular emphysema and aortic atherosclerosis, last 02/18/24. COPD status: stable Satisfied with current treatment?: yes Oxygen use:  no Dyspnea frequency: at baseline with activity, no worsening Cough frequency: rare to clear lungs Rescue inhaler frequency: rarely Limitation of activity: no Productive cough: no Last Spirometry: 08/28/23 Pneumovax: Up To Date Influenza: Up to Date   INFLAMMATORY ARTHRITIS: Follows with rheumatology, last 01/22/24.  No changes made. Continues Enbrel. Has daily baseline pain. Continues OTC Prilosec for GERD.  Functional Status Survey: Is the patient deaf or have difficulty hearing?: No Does the patient have difficulty seeing, even when wearing glasses/contacts?: No Does the patient have difficulty concentrating, remembering, or making decisions?: No Does the patient have difficulty walking or climbing stairs?: No Does the patient have difficulty dressing or bathing?: No Does the patient have difficulty doing errands alone such as visiting a doctor's office or shopping?: No  FALL RISK:    03/09/2024    9:05 AM 12/25/2023    2:09 PM 08/28/2023    8:46 AM 02/24/2023    8:23 AM 12/16/2022    2:35 PM  Fall Risk   Falls in the past year? 0 0 0 0 0  Number falls in past yr: 0 0 0 0 0  Injury with Fall? 0 0 0 0 0  Risk for fall due to : No Fall Risks No Fall Risks No Fall Risks No Fall Risks No Fall Risks  Follow up Falls evaluation completed Falls prevention discussed;Falls evaluation completed Falls evaluation completed Falls evaluation completed Falls prevention discussed;Falls evaluation completed    Depression Screen    03/09/2024    9:05 AM 12/25/2023    2:06 PM 08/28/2023  8:46 AM 02/24/2023    8:23 AM 12/16/2022    2:33 PM  Depression screen PHQ 2/9  Decreased Interest 0 0 0 0 0  Down, Depressed, Hopeless 0 0 0 0 0  PHQ - 2 Score 0 0 0 0 0  Altered sleeping 0 0 0 0 0  Tired, decreased energy 0 0 0 0 0  Change in appetite 0 0 0 0 0  Feeling bad or failure about yourself  0 0 0 0 0  Trouble concentrating 0 0 0 0 0  Moving slowly or fidgety/restless 0 0 0 0 0  Suicidal  thoughts 0 0 0 0 0  PHQ-9 Score 0 0 0 0 0  Difficult doing work/chores  Not difficult at all Not difficult at all Not difficult at all Not difficult at all      03/09/2024    9:05 AM 08/28/2023    8:47 AM 02/24/2023    8:23 AM 02/21/2022   10:53 AM  GAD 7 : Generalized Anxiety Score  Nervous, Anxious, on Edge 0 0 0 0  Control/stop worrying 0 0 0 0  Worry too much - different things 0 0 0 0  Trouble relaxing 0 0 0 0  Restless 0 0 0 0  Easily annoyed or irritable 0 0 0 0  Afraid - awful might happen 0 0 0 0  Total GAD 7 Score 0 0 0 0  Anxiety Difficulty  Not difficult at all Not difficult at all Not difficult at all   Functional Status Survey: Is the patient deaf or have difficulty hearing?: No Does the patient have difficulty seeing, even when wearing glasses/contacts?: No Does the patient have difficulty concentrating, remembering, or making decisions?: No Does the patient have difficulty walking or climbing stairs?: No Does the patient have difficulty dressing or bathing?: No Does the patient have difficulty doing errands alone such as visiting a doctor's office or shopping?: No   Past Medical History:  Past Medical History:  Diagnosis Date   Hypertension 2010   Inflammatory arthritis     Surgical History:  Past Surgical History:  Procedure Laterality Date   HEMORRHOID SURGERY      Medications:  Current Outpatient Medications on File Prior to Visit  Medication Sig   acetaminophen  (TYLENOL ) 500 MG tablet Take 1,000 mg by mouth every 6 (six) hours as needed.   ENBREL SURECLICK 50 MG/ML injection Inject 50 mg into the skin once a week.    ibuprofen  (ADVIL ) 200 MG tablet Take 400 mg by mouth every 6 (six) hours as needed.   Multiple Vitamin (MULTIVITAMIN ADULT PO) Take 1 tablet by mouth daily. Vitafusion   naproxen (NAPROSYN) 500 MG tablet Take 500 mg by mouth 2 (two) times daily.   omeprazole (PRILOSEC) 20 MG capsule Take 20 mg by mouth daily.   sildenafil  (VIAGRA ) 100 MG  tablet Take 0.5-1 tablets (50-100 mg total) by mouth daily as needed for erectile dysfunction.   vitamin C (ASCORBIC ACID) 250 MG tablet Take 2 tablets by mouth daily.   No current facility-administered medications on file prior to visit.    Allergies:  No Known Allergies  Social History:  Social History   Socioeconomic History   Marital status: Significant Other    Spouse name: Not on file   Number of children: 0   Years of education: Not on file   Highest education level: Associate degree: occupational, Scientist, product/process development, or vocational program  Occupational History   Occupation: disability  Tobacco Use  Smoking status: Every Day    Current packs/day: 0.50    Average packs/day: 0.5 packs/day for 47.5 years (23.8 ttl pk-yrs)    Types: Cigarettes    Start date: 1978   Smokeless tobacco: Never  Vaping Use   Vaping status: Never Used  Substance and Sexual Activity   Alcohol use: Yes    Comment: very rarely - 1 beer   Drug use: Never   Sexual activity: Yes  Other Topics Concern   Not on file  Social History Narrative   Not on file   Social Drivers of Health   Financial Resource Strain: Low Risk  (12/25/2023)   Overall Financial Resource Strain (CARDIA)    Difficulty of Paying Living Expenses: Not hard at all  Food Insecurity: No Food Insecurity (12/25/2023)   Hunger Vital Sign    Worried About Running Out of Food in the Last Year: Never true    Ran Out of Food in the Last Year: Never true  Transportation Needs: No Transportation Needs (12/25/2023)   PRAPARE - Administrator, Civil Service (Medical): No    Lack of Transportation (Non-Medical): No  Physical Activity: Insufficiently Active (12/25/2023)   Exercise Vital Sign    Days of Exercise per Week: 6 days    Minutes of Exercise per Session: 10 min  Stress: No Stress Concern Present (12/25/2023)   [employer] of Occupational Health - Occupational Stress Questionnaire    Feeling of Stress : Not at all   Social Connections: Moderately Isolated (12/25/2023)   Social Connection and Isolation Panel    Frequency of Communication with Friends and Family: More than three times a week    Frequency of Social Gatherings with Friends and Family: Twice a week    Attends Religious Services: Never    Database administrator or Organizations: No    Attends Banker Meetings: Never    Marital Status: Living with partner  Intimate Partner Violence: Not At Risk (12/25/2023)   Humiliation, Afraid, Rape, and Kick questionnaire    Fear of Current or Ex-Partner: No    Emotionally Abused: No    Physically Abused: No    Sexually Abused: No   Social History   Tobacco Use  Smoking Status Every Day   Current packs/day: 0.50   Average packs/day: 0.5 packs/day for 47.5 years (23.8 ttl pk-yrs)   Types: Cigarettes   Start date: 1978  Smokeless Tobacco Never   Social History   Substance and Sexual Activity  Alcohol Use Yes   Comment: very rarely - 1 beer    Family History:  Family History  Problem Relation Age of Onset   Diabetes Mother    Diabetes Father    Hypertension Father    Arthritis Father    Diabetes Brother     Past medical history, surgical history, medications, allergies, family history and social history reviewed with patient today and changes made to appropriate areas of the chart.   ROS All other ROS negative except what is listed above and in the HPI.      Objective:    BP 134/71 (BP Location: Left Arm, Patient Position: Sitting, Cuff Size: Normal)   Pulse (!) 56   Temp 98 F (36.7 C) (Oral)   Resp 16   Ht 5' 10 (1.778 m)   Wt 210 lb 3.2 oz (95.3 kg)   SpO2 92%   BMI 30.16 kg/m   Wt Readings from Last 3 Encounters:  03/09/24 210 lb  3.2 oz (95.3 kg)  12/31/23 209 lb (94.8 kg)  12/25/23 218 lb (98.9 kg)    Physical Exam Vitals and nursing note reviewed.  Constitutional:      General: He is awake. He is not in acute distress.    Appearance: He is  well-developed and well-groomed. He is not ill-appearing or toxic-appearing.  HENT:     Head: Normocephalic and atraumatic.     Right Ear: Hearing, tympanic membrane, ear canal and external ear normal. No drainage.     Left Ear: Hearing, tympanic membrane, ear canal and external ear normal. No drainage.     Nose: Nose normal.     Mouth/Throat:     Pharynx: Uvula midline.  Eyes:     General: Lids are normal.        Right eye: No discharge.        Left eye: No discharge.     Extraocular Movements: Extraocular movements intact.     Conjunctiva/sclera: Conjunctivae normal.     Pupils: Pupils are equal, round, and reactive to light.     Visual Fields: Right eye visual fields normal and left eye visual fields normal.  Neck:     Thyroid : No thyromegaly.     Vascular: No carotid bruit or JVD.     Trachea: Trachea normal.  Cardiovascular:     Rate and Rhythm: Normal rate and regular rhythm.     Heart sounds: Normal heart sounds, S1 normal and S2 normal. No murmur heard.    No gallop.  Pulmonary:     Effort: Pulmonary effort is normal. No accessory muscle usage or respiratory distress.     Breath sounds: Normal breath sounds.  Abdominal:     General: Bowel sounds are normal.     Palpations: Abdomen is soft. There is no hepatomegaly or splenomegaly.     Tenderness: There is no abdominal tenderness.  Musculoskeletal:        General: Normal range of motion.     Cervical back: Normal range of motion and neck supple.     Right lower leg: No edema.     Left lower leg: No edema.  Lymphadenopathy:     Head:     Right side of head: No submental, submandibular, tonsillar, preauricular or posterior auricular adenopathy.     Left side of head: No submental, submandibular, tonsillar, preauricular or posterior auricular adenopathy.     Cervical: No cervical adenopathy.  Skin:    General: Skin is warm and dry.     Capillary Refill: Capillary refill takes less than 2 seconds.     Findings: No rash.   Neurological:     Mental Status: He is alert and oriented to person, place, and time.     Gait: Gait is intact.     Deep Tendon Reflexes: Reflexes are normal and symmetric.     Reflex Scores:      Brachioradialis reflexes are 2+ on the right side and 2+ on the left side.      Patellar reflexes are 2+ on the right side and 2+ on the left side. Psychiatric:        Attention and Perception: Attention normal.        Mood and Affect: Mood normal.        Speech: Speech normal.        Behavior: Behavior normal. Behavior is cooperative.        Thought Content: Thought content normal.        Cognition  and Memory: Cognition normal.       12/25/2023    2:10 PM 12/16/2022    2:40 PM  6CIT Screen  What Year? 0 points 0 points  What month? 0 points 0 points  What time? 0 points 0 points  Count back from 20 0 points 0 points  Months in reverse 0 points 0 points  Repeat phrase 0 points 0 points  Total Score 0 points 0 points   Results for orders placed or performed in visit on 08/28/23  Bayer DCA Hb A1c Waived   Collection Time: 08/28/23  8:36 AM  Result Value Ref Range   HB A1C (BAYER DCA - WAIVED) 5.8 (H) 4.8 - 5.6 %  Basic metabolic panel   Collection Time: 08/28/23  8:36 AM  Result Value Ref Range   Glucose 110 (H) 70 - 99 mg/dL   BUN 16 8 - 27 mg/dL   Creatinine, Ser 8.78 0.76 - 1.27 mg/dL   eGFR 67 >40 fO/fpw/8.26   BUN/Creatinine Ratio 13 10 - 24   Sodium 141 134 - 144 mmol/L   Potassium 4.5 3.5 - 5.2 mmol/L   Chloride 102 96 - 106 mmol/L   CO2 27 20 - 29 mmol/L   Calcium  9.6 8.6 - 10.2 mg/dL  Lipid Panel w/o Chol/HDL Ratio   Collection Time: 08/28/23  8:36 AM  Result Value Ref Range   Cholesterol, Total 127 100 - 199 mg/dL   Triglycerides 73 0 - 149 mg/dL   HDL 48 >60 mg/dL   VLDL Cholesterol Cal 15 5 - 40 mg/dL   LDL Chol Calc (NIH) 64 0 - 99 mg/dL      Assessment & Plan:   Problem List Items Addressed This Visit       Cardiovascular and Mediastinum    Hypertension   Chronic, stable.  BP at goal in office.  Recommend he monitor BP at least a few mornings a week at home and document.  DASH diet at home.  Continue current medication regimen and adjust as needed.  Labs today: CBC, CMP, TSH, urine ALB.  Urine ALB 11 March 2024.  Refills sent.       Relevant Medications   hydrochlorothiazide  (HYDRODIURIL ) 25 MG tablet   rosuvastatin  (CRESTOR ) 10 MG tablet (Start on 03/10/2024)   Other Relevant Orders   Microalbumin, Urine Waived   CBC with Differential/Platelet   Comprehensive metabolic panel with GFR   TSH   Aortic atherosclerosis (HCC)   Chronic.  Noted on CT imaging 11/13/21.  Recommend complete cessation of smoking and continue statin therapy.      Relevant Medications   hydrochlorothiazide  (HYDRODIURIL ) 25 MG tablet   rosuvastatin  (CRESTOR ) 10 MG tablet (Start on 03/10/2024)   Other Relevant Orders   Comprehensive metabolic panel with GFR   Lipid Panel w/o Chol/HDL Ratio     Respiratory   Centrilobular emphysema (HCC) - Primary   Chronic, progressive.  Recommend complete cessation of smoking or at least cutting back. Continue Breztri  due to his symptom and stage of disease, benefits more from triple therapy.  Educated him on this inhaler and use + side effects.  Continue Albuterol  as needed, especially before activity.  Educated him on importance of proper inhaler use due to progressive nature of disease.  Lung cancer screening annually, next 02/17/25.  Discussed that he would benefit from a visit with pulmonary for more testing and recommendations.  He refuses at this time.      Relevant Medications   albuterol  (  PROVENTIL  HFA) 108 (90 Base) MCG/ACT inhaler   budesonide-glycopyrrolate-formoterol (BREZTRI  AEROSPHERE) 160-9-4.8 MCG/ACT AERO inhaler   Other Relevant Orders   CBC with Differential/Platelet     Digestive   GERD (gastroesophageal reflux disease)   Chronic, stable, continues Prilosec for GI protection with his inflammatory  arthritis and Naproxen use.  Continue this and check Mag level today.      Relevant Orders   Magnesium     Musculoskeletal and Integument   Osteopenia   Diffuse noted on imaging 02/18/23.  Recommend taking Vitamin D3 2000 units daily and ensuring plenty of exercise to help with muscle mass and bone health.  Recommend complete cessation of smoking.      Relevant Orders   VITAMIN D  25 Hydroxy (Vit-D Deficiency, Fractures)   Chronic inflammatory arthritis   Chronic, followed with rheumatology at [facility], continue this collaboration and medications as ordered by them.      Relevant Orders   CBC with Differential/Platelet   Comprehensive metabolic panel with GFR     Other   Obesity   BMI 30.16.  Recommended eating smaller high protein, low fat meals more frequently and exercising 30 mins a day 5 times a week with a goal of 10-15lb weight loss in the next 3 months. Patient voiced their understanding and motivation to adhere to these recommendations.      Nicotine dependence, cigarettes, uncomplicated   I have recommended complete cessation of tobacco use. I have discussed various options available for assistance with tobacco cessation including over the counter methods (Nicotine gum, patch and lozenges). We also discussed prescription options (Chantix, Nicotine Inhaler / Nasal Spray). The patient is not interested in pursuing any prescription tobacco cessation options at this time.       Hypercholesteremia   Chronic, ongoing with history of poor tolerance to Atorvastatin .  Continue Rosuvastatin  three days a week, which he is tolerating.  Lipid panel today.      Relevant Medications   hydrochlorothiazide  (HYDRODIURIL ) 25 MG tablet   rosuvastatin  (CRESTOR ) 10 MG tablet (Start on 03/10/2024)   Other Relevant Orders   Comprehensive metabolic panel with GFR   Lipid Panel w/o Chol/HDL Ratio   Elevated hemoglobin A1c measurement   A1c is trending up to 6% from 5.8%.  Urine ALB 11 March 2024.  Recommend heavy focus on diet and regular exercise to prevent ongoing trend up in this level.  Discussed risks at length.      Relevant Orders   Bayer DCA Hb A1c Waived   Microalbumin, Urine Waived   Other Visit Diagnoses       Benign prostatic hyperplasia without lower urinary tract symptoms       PSA on labs today.   Relevant Orders   PSA     Encounter for annual physical exam       Annual physical today with labs and health maintenance reviewed, discussed with patient.       Discussed aspirin prophylaxis for myocardial infarction prevention and decision was it was not indicated  LABORATORY TESTING:  Health maintenance labs ordered today as discussed above.   The natural history of prostate cancer and ongoing controversy regarding screening and potential treatment outcomes of prostate cancer has been discussed with the patient. The meaning of a false positive PSA and a false negative PSA has been discussed. He indicates understanding of the limitations of this screening test and wishes to proceed with screening PSA testing.  IMMUNIZATIONS:   - Tdap: Tetanus vaccination status reviewed:  last tetanus booster within 10 years. - Influenza: Up to date - Pneumovax: Up to date - Prevnar: Refused - Zostavax vaccine: Refused  SCREENING: - Colonoscopy: Refused -- does not want to go through this mess Discussed with patient purpose of the colonoscopy is to detect colon cancer at curable precancerous or early stages   - AAA Screening: Not applicable obtain at age 2 -Hearing Test: Not applicable  -Spirometry: Not applicable   PATIENT COUNSELING:    Sexuality: Discussed sexually transmitted diseases, partner selection, use of condoms, avoidance of unintended pregnancy  and contraceptive alternatives.   Advised to avoid cigarette smoking.  I discussed with the patient that most people either abstain from alcohol or drink within safe limits (<=14/week and <=4  drinks/occasion for males, <=7/weeks and <= 3 drinks/occasion for females) and that the risk for alcohol disorders and other health effects rises proportionally with the number of drinks per week and how often a drinker exceeds daily limits.  Discussed cessation/primary prevention of drug use and availability of treatment for abuse.   Diet: Encouraged to adjust caloric intake to maintain  or achieve ideal body weight, to reduce intake of dietary saturated fat and total fat, to limit sodium intake by avoiding high sodium foods and not adding table salt, and to maintain adequate dietary potassium and calcium  preferably from fresh fruits, vegetables, and low-fat dairy products.    stressed the importance of regular exercise  Injury prevention: Discussed safety belts, safety helmets, smoke detector, smoking near bedding or upholstery.   Dental health: Discussed importance of regular tooth brushing, flossing, and dental visits.   Follow up plan: NEXT PREVENTATIVE PHYSICAL DUE IN 1 YEAR. Return in about 6 months (around 09/09/2024) for COPD, HTN/HLD, ARTHRITIS -- with spirometry.

## 2024-03-09 NOTE — Assessment & Plan Note (Signed)
 Chronic.  Noted on CT imaging 11/13/21.  Recommend complete cessation of smoking and continue statin therapy.

## 2024-03-09 NOTE — Assessment & Plan Note (Signed)
Chronic, stable, continues Prilosec for GI protection with his inflammatory arthritis and Naproxen use.  Continue this and check Mag level today. °

## 2024-03-09 NOTE — Assessment & Plan Note (Signed)
 Chronic, progressive.  Recommend complete cessation of smoking or at least cutting back. Continue Breztri  due to his symptom and stage of disease, benefits more from triple therapy.  Educated him on this inhaler and use + side effects.  Continue Albuterol  as needed, especially before activity.  Educated him on importance of proper inhaler use due to progressive nature of disease.  Lung cancer screening annually, next 02/17/25.  Discussed that he would benefit from a visit with pulmonary for more testing and recommendations.  He refuses at this time.

## 2024-03-09 NOTE — Assessment & Plan Note (Signed)
 I have recommended complete cessation of tobacco use. I have discussed various options available for assistance with tobacco cessation including over the counter methods (Nicotine gum, patch and lozenges). We also discussed prescription options (Chantix, Nicotine Inhaler / Nasal Spray). The patient is not interested in pursuing any prescription tobacco cessation options at this time.

## 2024-03-09 NOTE — Assessment & Plan Note (Signed)
Diffuse noted on imaging 02/18/23.  Recommend taking Vitamin D3 2000 units daily and ensuring plenty of exercise to help with muscle mass and bone health.  Recommend complete cessation of smoking.

## 2024-03-09 NOTE — Assessment & Plan Note (Signed)
 A1c is trending up to 6% from 5.8%.  Urine ALB 11 March 2024.  Recommend heavy focus on diet and regular exercise to prevent ongoing trend up in this level.  Discussed risks at length.

## 2024-03-10 ENCOUNTER — Ambulatory Visit: Payer: Self-pay | Admitting: Nurse Practitioner

## 2024-03-10 LAB — CBC WITH DIFFERENTIAL/PLATELET
Basophils Absolute: 0.1 x10E3/uL (ref 0.0–0.2)
Basos: 1 %
EOS (ABSOLUTE): 0.1 x10E3/uL (ref 0.0–0.4)
Eos: 2 %
Hematocrit: 47.4 % (ref 37.5–51.0)
Hemoglobin: 15.8 g/dL (ref 13.0–17.7)
Immature Grans (Abs): 0 x10E3/uL (ref 0.0–0.1)
Immature Granulocytes: 0 %
Lymphocytes Absolute: 2.3 x10E3/uL (ref 0.7–3.1)
Lymphs: 39 %
MCH: 32.9 pg (ref 26.6–33.0)
MCHC: 33.3 g/dL (ref 31.5–35.7)
MCV: 99 fL — ABNORMAL HIGH (ref 79–97)
Monocytes Absolute: 0.6 x10E3/uL (ref 0.1–0.9)
Monocytes: 10 %
Neutrophils Absolute: 2.8 x10E3/uL (ref 1.4–7.0)
Neutrophils: 48 %
Platelets: 237 x10E3/uL (ref 150–450)
RBC: 4.8 x10E6/uL (ref 4.14–5.80)
RDW: 12.2 % (ref 11.6–15.4)
WBC: 5.9 x10E3/uL (ref 3.4–10.8)

## 2024-03-10 LAB — COMPREHENSIVE METABOLIC PANEL WITH GFR
ALT: 17 IU/L (ref 0–44)
AST: 19 IU/L (ref 0–40)
Albumin: 4.4 g/dL (ref 3.9–4.9)
Alkaline Phosphatase: 72 IU/L (ref 44–121)
BUN/Creatinine Ratio: 7 — ABNORMAL LOW (ref 10–24)
BUN: 9 mg/dL (ref 8–27)
Bilirubin Total: 0.6 mg/dL (ref 0.0–1.2)
CO2: 24 mmol/L (ref 20–29)
Calcium: 9.9 mg/dL (ref 8.6–10.2)
Chloride: 101 mmol/L (ref 96–106)
Creatinine, Ser: 1.22 mg/dL (ref 0.76–1.27)
Globulin, Total: 2 g/dL (ref 1.5–4.5)
Glucose: 78 mg/dL (ref 70–99)
Potassium: 4.4 mmol/L (ref 3.5–5.2)
Sodium: 139 mmol/L (ref 134–144)
Total Protein: 6.4 g/dL (ref 6.0–8.5)
eGFR: 67 mL/min/1.73 (ref >59)

## 2024-03-10 LAB — LIPID PANEL W/O CHOL/HDL RATIO
Cholesterol, Total: 124 mg/dL (ref 100–199)
HDL: 51 mg/dL (ref >39)
LDL Chol Calc (NIH): 58 mg/dL (ref 0–99)
Triglycerides: 76 mg/dL (ref 0–149)
VLDL Cholesterol Cal: 15 mg/dL (ref 5–40)

## 2024-03-10 LAB — MAGNESIUM: Magnesium: 2 mg/dL (ref 1.6–2.3)

## 2024-03-10 LAB — PSA: Prostate Specific Ag, Serum: 2.3 ng/mL (ref 0.0–4.0)

## 2024-03-10 LAB — TSH: TSH: 1.16 u[IU]/mL (ref 0.450–4.500)

## 2024-03-10 LAB — VITAMIN D 25 HYDROXY (VIT D DEFICIENCY, FRACTURES): Vit D, 25-Hydroxy: 43.9 ng/mL (ref 30.0–100.0)

## 2024-03-10 NOTE — Progress Notes (Signed)
 Contacted via MyChart  Good morning Hunter Huffman, your labs have returned and overall remain stable.  No medication changes needed. Any questions? Keep being stellar!!  Thank you for allowing me to participate in your care.  I appreciate you. Kindest regards, Eun Vermeer

## 2024-04-26 DIAGNOSIS — M47819 Spondylosis without myelopathy or radiculopathy, site unspecified: Secondary | ICD-10-CM | POA: Diagnosis not present

## 2024-05-28 ENCOUNTER — Encounter: Payer: Self-pay | Admitting: Nurse Practitioner

## 2024-05-28 NOTE — Telephone Encounter (Signed)
 Called patient and left a message for hin to call back to get scheduled for smoking cessation.

## 2024-05-31 ENCOUNTER — Ambulatory Visit (INDEPENDENT_AMBULATORY_CARE_PROVIDER_SITE_OTHER)

## 2024-05-31 DIAGNOSIS — Z23 Encounter for immunization: Secondary | ICD-10-CM

## 2024-05-31 NOTE — Telephone Encounter (Signed)
 Scheduled

## 2024-05-31 NOTE — Progress Notes (Signed)
 Patient is in office today for a nurse visit for FLU vaccine. Patient Injection was given in the  Left deltoid. Patient tolerated injection well.

## 2024-05-31 NOTE — Patient Instructions (Signed)
 Be Involved in Caring For Your Health:  Taking Medications When medications are taken as directed, they can greatly improve your health. But if they are not taken as prescribed, they may not work. In some cases, not taking them correctly can be harmful. To help ensure your treatment remains effective and safe, understand your medications and how to take them. Bring your medications to each visit for review by your provider.  Your lab results, notes, and after visit summary will be available on My Chart. We strongly encourage you to use this feature. If lab results are abnormal the clinic will contact you with the appropriate steps. If the clinic does not contact you assume the results are satisfactory. You can always view your results on My Chart. If you have questions regarding your health or results, please contact the clinic during office hours. You can also ask questions on My Chart.  We at [facility] are grateful that you chose us  to provide your care. We strive to provide evidence-based and compassionate care and are always looking for feedback. If you get a survey from the clinic please complete this so we can hear your opinions.   Managing the Challenge of Quitting Smoking Quitting smoking is a physical and mental challenge. You may have cravings, withdrawal symptoms, and temptation to smoke. Before quitting, work with your health care provider to make a plan that can help you manage quitting. Making a plan before you quit may keep you from smoking when you have the urge to smoke while trying to quit. How to manage lifestyle changes Managing stress Stress can make you want to smoke, and wanting to smoke may cause stress. It is important to find ways to manage your stress. You could try some of the following: Practice relaxation techniques. Breathe slowly and deeply, in through your nose and out through your mouth. Listen to music. Soak in a bath or take a shower. Imagine a  peaceful place or vacation. Get some support. Talk with family or friends about your stress. Join a support group. Talk with a counselor or therapist. Get some physical activity. Go for a walk, run, or bike ride. Play a favorite sport. Practice yoga.  Medicines Talk with your health care provider about medicines that might help you deal with cravings and make quitting easier for you. Relationships Social situations can be difficult when you are quitting smoking. To manage this, you can: Avoid parties and other social situations where people might be smoking. Avoid alcohol. Leave right away if you have the urge to smoke. Explain to your family and friends that you are quitting smoking. Ask for support and let them know you might be a bit grumpy. Plan activities where smoking is not an option. General instructions Be aware that many people gain weight after they quit smoking. However, not everyone does. To keep from gaining weight, have a plan in place before you quit, and stick to the plan after you quit. Your plan should include: Eating healthy snacks. When you have a craving, it may help to: Eat popcorn, or try carrots, celery, or other cut vegetables. Chew sugar-free gum. Changing how you eat. Eat small portion sizes at meals. Eat 4-6 small meals throughout the day instead of 1-2 large meals a day. Be mindful when you eat. You should avoid watching television or doing other things that might distract you as you eat. Exercising regularly. Make time to exercise each day. If you do not have time for a  long workout, do short bouts of exercise for 5-10 minutes several times a day. Do some form of strengthening exercise, such as weight lifting. Do some exercise that gets your heart beating and causes you to breathe deeply, such as walking fast, running, swimming, or biking. This is very important. Drinking plenty of water or other low-calorie or no-calorie drinks. Drink enough fluid to  keep your urine pale yellow.  How to recognize withdrawal symptoms Your body and mind may experience discomfort as you try to get used to not having nicotine in your system. These effects are called withdrawal symptoms. They may include: Feeling hungrier than normal. Having trouble concentrating. Feeling irritable or restless. Having trouble sleeping. Feeling depressed. Craving a cigarette. These symptoms may surprise you, but they are normal to have when quitting smoking. To manage withdrawal symptoms: Avoid places, people, and activities that trigger your cravings. Remember why you want to quit. Get plenty of sleep. Avoid coffee and other drinks that contain caffeine. These may worsen some of your symptoms. How to manage cravings Come up with a plan for how to deal with your cravings. The plan should include the following: A definition of the specific situation you want to deal with. An activity or action you will take to replace smoking. A clear idea for how this action will help. The name of someone who could help you with this. Cravings usually last for 5-10 minutes. Consider taking the following actions to help you with your plan to deal with cravings: Keep your mouth busy. Chew sugar-free gum. Suck on hard candies or a straw. Brush your teeth. Keep your hands and body busy. Change to a different activity right away. Squeeze or play with a ball. Do an activity or a hobby, such as making bead jewelry, practicing needlepoint, or working with wood. Mix up your normal routine. Take a short exercise break. Go for a quick walk, or run up and down stairs. Focus on doing something kind or helpful for someone else. Call a friend or family member to talk during a craving. Join a support group. Contact a quitline. Where to find support To get help or find a support group: Call the National Cancer Institute's Smoking Quitline: 1-800-QUIT-NOW (334)672-7906) Text QUIT to SmokefreeTXT:  521151 Where to find more information Visit these websites to find more information on quitting smoking: U.S. Department of Health and Human Services: www.smokefree.gov American Lung Association: www.freedomfromsmoking.org Centers for Disease Control and Prevention (CDC): FootballExhibition.com.br American Heart Association: www.heart.org Contact a health care provider if: You want to change your plan for quitting. The medicines you are taking are not helping. Your eating feels out of control or you cannot sleep. You feel depressed or become very anxious. Summary Quitting smoking is a physical and mental challenge. You will face cravings, withdrawal symptoms, and temptation to smoke again. Preparation can help you as you go through these challenges. Try different techniques to manage stress, handle social situations, and prevent weight gain. You can deal with cravings by keeping your mouth busy (such as by chewing gum), keeping your hands and body busy, calling family or friends, or contacting a quitline for people who want to quit smoking. You can deal with withdrawal symptoms by avoiding places where people smoke, getting plenty of rest, and avoiding drinks that contain caffeine. This information is not intended to replace advice given to you by your health care provider. Make sure you discuss any questions you have with your health care provider. Document Revised: 08/10/2021 Document  Reviewed: 08/10/2021 Elsevier Patient Education  2024 [employer].

## 2024-05-31 NOTE — Telephone Encounter (Signed)
 Noted

## 2024-06-01 ENCOUNTER — Telehealth (INDEPENDENT_AMBULATORY_CARE_PROVIDER_SITE_OTHER): Admitting: Nurse Practitioner

## 2024-06-01 DIAGNOSIS — J432 Centrilobular emphysema: Secondary | ICD-10-CM

## 2024-06-01 DIAGNOSIS — F17210 Nicotine dependence, cigarettes, uncomplicated: Secondary | ICD-10-CM

## 2024-06-01 MED ORDER — NICOTINE 14 MG/24HR TD PT24: 14.0000 mg | MEDICATED_PATCH | Freq: Every day | TRANSDERMAL | 2 refills | Status: DC

## 2024-06-01 MED ORDER — NICOTINE 21 MG/24HR TD PT24: 21.0000 mg | MEDICATED_PATCH | Freq: Every day | TRANSDERMAL | 0 refills | Status: DC

## 2024-06-01 NOTE — Assessment & Plan Note (Signed)
 Chronic, progressive.  Recommend complete cessation of smoking, he wishes to work on this. Continue Breztri  due to his symptom and stage of disease, benefits more from triple therapy.  Educated him on this inhaler and use + side effects.  Continue Albuterol  as needed, especially before activity.  Educated him on importance of proper inhaler use due to progressive nature of disease.  Lung cancer screening annually, next 02/17/25.  Discussed that he would benefit from a visit with pulmonary for more testing and recommendations.  He refuses at this time.

## 2024-06-01 NOTE — Progress Notes (Signed)
 There were no vitals taken for this visit.   Subjective:    Patient ID: Hunter Huffman, male    DOB: 23-Sep-1960, 63 y.o.   MRN: [id number]  HPI: Hunter Huffman is a 63 y.o. male  Chief Complaint  Patient presents with   Nicotine Dependence    Wanting to starting the process. Not looked into resources or any available help. 40 + years 1 pack daily. Knows he needs to.    Virtual Visit via Video Note  I connected with Ryzen T Kusek on 06/01/24 at  4:20 PM EDT by a video enabled telemedicine application and verified that I am speaking with the correct person using two identifiers.  Location: Patient: home Provider: work   I discussed the limitations of evaluation and management by telemedicine and the availability of in person appointments. The patient expressed understanding and agreed to proceed.  I discussed the assessment and treatment plan with the patient. The patient was provided an opportunity to ask questions and all were answered. The patient agreed with the plan and demonstrated an understanding of the instructions.   The patient was advised to call back or seek an in-person evaluation if the symptoms worsen or if the condition fails to improve as anticipated.  I provided 25 minutes of non-face-to-face time during this encounter.   Tuwanna Krausz T Oshay Stranahan, NP   SMOKING CESSATION Continues to smoke, smoking 1/2 PPD to 1 PPD cigarettes a day. Has been smoking since teen years. He tried a person's pouches while on vacation recently, did not like taste but worked well.  He would like to try patches. Smoking Status: as above Smoking Amount: as above Smoking Onset: 40 years ago Smoking Quit Date: unsure Smoking triggers: psychological and after eats Type of tobacco use: cigarettes Children in the house: no Other household members who smoke: no Treatments attempted: none Pneumovax: Up To Date  Relevant past medical, surgical, family and social history reviewed and  updated as indicated. Interim medical history since our last visit reviewed. Allergies and medications reviewed and updated.  Review of Systems  Constitutional:  Negative for activity change, appetite change, diaphoresis, fatigue and unexpected weight change.  Respiratory:  Negative for cough, chest tightness, shortness of breath and wheezing.   Cardiovascular:  Negative for chest pain, palpitations and leg swelling.  Gastrointestinal: Negative.   Skin: Negative.   Neurological: Negative.   Psychiatric/Behavioral: Negative.      Per HPI unless specifically indicated above     Objective:    There were no vitals taken for this visit.  Wt Readings from Last 3 Encounters:  03/09/24 210 lb 3.2 oz (95.3 kg)  12/31/23 209 lb (94.8 kg)  12/25/23 218 lb (98.9 kg)    Physical Exam Vitals and nursing note reviewed.  Constitutional:      General: He is awake. He is not in acute distress.    Appearance: He is well-developed. He is not ill-appearing.  HENT:     Head: Normocephalic.     Right Ear: Hearing normal. No drainage.     Left Ear: Hearing normal. No drainage.  Eyes:     General: Lids are normal.        Right eye: No discharge.        Left eye: No discharge.     Conjunctiva/sclera: Conjunctivae normal.  Pulmonary:     Effort: Pulmonary effort is normal. No accessory muscle usage or respiratory distress.  Musculoskeletal:     Cervical back: Normal range  of motion.  Neurological:     Mental Status: He is alert and oriented to person, place, and time.  Psychiatric:        Mood and Affect: Mood normal.        Behavior: Behavior normal. Behavior is cooperative.        Thought Content: Thought content normal.        Judgment: Judgment normal.    Results for orders placed or performed in visit on 03/09/24  Bayer DCA Hb A1c Waived   Collection Time: 03/09/24  9:14 AM  Result Value Ref Range   HB A1C (BAYER DCA - WAIVED) 6.0 (H) 4.8 - 5.6 %  Microalbumin, Urine Waived    Collection Time: 03/09/24  9:14 AM  Result Value Ref Range   Microalb, Ur Waived 10 0 - 19 mg/L   Creatinine, Urine Waived 50 10 - 300 mg/dL   Microalb/Creat Ratio <30 <30 mg/g  CBC with Differential/Platelet   Collection Time: 03/09/24  9:15 AM  Result Value Ref Range   WBC 5.9 3.4 - 10.8 x10E3/uL   RBC 4.80 4.14 - 5.80 x10E6/uL   Hemoglobin 15.8 13.0 - 17.7 g/dL   Hematocrit 52.5 62.4 - 51.0 %   MCV 99 (H) 79 - 97 fL   MCH 32.9 26.6 - 33.0 pg   MCHC 33.3 31.5 - 35.7 g/dL   RDW 87.7 88.3 - 84.5 %   Platelets 237 150 - 450 x10E3/uL   Neutrophils 48 Not Estab. %   Lymphs 39 Not Estab. %   Monocytes 10 Not Estab. %   Eos 2 Not Estab. %   Basos 1 Not Estab. %   Neutrophils Absolute 2.8 1.4 - 7.0 x10E3/uL   Lymphocytes Absolute 2.3 0.7 - 3.1 x10E3/uL   Monocytes Absolute 0.6 0.1 - 0.9 x10E3/uL   EOS (ABSOLUTE) 0.1 0.0 - 0.4 x10E3/uL   Basophils Absolute 0.1 0.0 - 0.2 x10E3/uL   Immature Granulocytes 0 Not Estab. %   Immature Grans (Abs) 0.0 0.0 - 0.1 x10E3/uL  Comprehensive metabolic panel with GFR   Collection Time: 03/09/24  9:15 AM  Result Value Ref Range   Glucose 78 70 - 99 mg/dL   BUN 9 8 - 27 mg/dL   Creatinine, Ser 8.77 0.76 - 1.27 mg/dL   eGFR 67 >40 fO/fpw/8.26   BUN/Creatinine Ratio 7 (L) 10 - 24   Sodium 139 134 - 144 mmol/L   Potassium 4.4 3.5 - 5.2 mmol/L   Chloride 101 96 - 106 mmol/L   CO2 24 20 - 29 mmol/L   Calcium  9.9 8.6 - 10.2 mg/dL   Total Protein 6.4 6.0 - 8.5 g/dL   Albumin 4.4 3.9 - 4.9 g/dL   Globulin, Total 2.0 1.5 - 4.5 g/dL   Bilirubin Total 0.6 0.0 - 1.2 mg/dL   Alkaline Phosphatase 72 44 - 121 IU/L   AST 19 0 - 40 IU/L   ALT 17 0 - 44 IU/L  Lipid Panel w/o Chol/HDL Ratio   Collection Time: 03/09/24  9:15 AM  Result Value Ref Range   Cholesterol, Total 124 100 - 199 mg/dL   Triglycerides 76 0 - 149 mg/dL   HDL 51 >60 mg/dL   VLDL Cholesterol Cal 15 5 - 40 mg/dL   LDL Chol Calc (NIH) 58 0 - 99 mg/dL  TSH   Collection Time: 03/09/24   9:15 AM  Result Value Ref Range   TSH 1.160 0.450 - 4.500 uIU/mL  Magnesium   Collection Time:  03/09/24  9:15 AM  Result Value Ref Range   Magnesium 2.0 1.6 - 2.3 mg/dL  PSA   Collection Time: 03/09/24  9:15 AM  Result Value Ref Range   Prostate Specific Ag, Serum 2.3 0.0 - 4.0 ng/mL  VITAMIN D  25 Hydroxy (Vit-D Deficiency, Fractures)   Collection Time: 03/09/24  9:15 AM  Result Value Ref Range   Vit D, 25-Hydroxy 43.9 30.0 - 100.0 ng/mL      Assessment & Plan:   Problem List Items Addressed This Visit       Respiratory   Centrilobular emphysema (HCC) - Primary   Chronic, progressive.  Recommend complete cessation of smoking, he wishes to work on this. Continue Breztri  due to his symptom and stage of disease, benefits more from triple therapy.  Educated him on this inhaler and use + side effects.  Continue Albuterol  as needed, especially before activity.  Educated him on importance of proper inhaler use due to progressive nature of disease.  Lung cancer screening annually, next 02/17/25.  Discussed that he would benefit from a visit with pulmonary for more testing and recommendations.  He refuses at this time.      Relevant Medications   nicotine (NICODERM CQ - DOSED IN MG/24 HOURS) 21 mg/24hr patch   nicotine (NICODERM CQ - DOSED IN MG/24 HOURS) 14 mg/24hr patch (Start on 07/06/2024)     Other   Nicotine dependence, cigarettes, uncomplicated   I have recommended complete cessation of tobacco use. I have discussed various options available for assistance with tobacco cessation including over the counter methods (Nicotine gum, patch and lozenges). We also discussed prescription options (Chantix, Nicotine Inhaler / Nasal Spray). He would like to trial patches, have sent these in to start at 21 MG dosing.       Relevant Medications   nicotine (NICODERM CQ - DOSED IN MG/24 HOURS) 21 mg/24hr patch   nicotine (NICODERM CQ - DOSED IN MG/24 HOURS) 14 mg/24hr patch (Start on 07/06/2024)      Follow up plan: Return in about 6 weeks (around 07/13/2024) for Smoking cessation - started patches.

## 2024-06-01 NOTE — Assessment & Plan Note (Signed)
 I have recommended complete cessation of tobacco use. I have discussed various options available for assistance with tobacco cessation including over the counter methods (Nicotine gum, patch and lozenges). We also discussed prescription options (Chantix, Nicotine Inhaler / Nasal Spray). He would like to trial patches, have sent these in to start at 21 MG dosing.

## 2024-06-02 NOTE — Progress Notes (Signed)
 Scheduled

## 2024-06-16 NOTE — Progress Notes (Signed)
 Hunter Huffman                                          MRN: [id number]   06/16/2024   The [facility] Quality Team Specialist reviewed this patient medical record for the purposes of chart review for care gap closure. The following were reviewed: chart review for care gap closure-colorectal cancer screening.    VBCI Quality Team

## 2024-07-14 ENCOUNTER — Ambulatory Visit: Admitting: Nurse Practitioner

## 2024-08-03 ENCOUNTER — Other Ambulatory Visit: Payer: Self-pay | Admitting: Nurse Practitioner

## 2024-08-05 NOTE — Telephone Encounter (Signed)
 Too soon for refill.  Requested Prescriptions  Pending Prescriptions Disp Refills   albuterol  (VENTOLIN  HFA) 108 (90 Base) MCG/ACT inhaler [Pharmacy Med Name: ALBUTEROL  HFA 90MCG/ACT (PV)] 26.8 g 2    Sig: USE 2 INHALATIONS BY MOUTH EVERY 6 HOURS AS NEEDED FOR WHEEZING  OR SHORTNESS OF BREATH     Pulmonology:  Beta Agonists 2 Passed - 08/05/2024  4:16 PM      Passed - Last BP in normal range    BP Readings from Last 1 Encounters:  03/09/24 134/71         Passed - Last Heart Rate in normal range    Pulse Readings from Last 1 Encounters:  03/09/24 (!) 56         Passed - Valid encounter within last 12 months    Recent Outpatient Visits           2 months ago Centrilobular emphysema (HCC)   [place] [facility] [place], Olla T, NP   4 months ago Centrilobular emphysema (HCC)   [place] [facility] [place], [place] T, NP   7 months ago COPD with acute exacerbation (HCC)   [place] [facility] [place], Melanie T, NP   8 months ago COPD with acute exacerbation (HCC)   [place] [facility] [place], Melanie T, NP   8 months ago COPD with acute exacerbation [facility])   [place] [facility] [place], Melanie DASEN, NP

## 2024-09-04 NOTE — Patient Instructions (Signed)
 Be Involved in Caring For Your Health:  Taking Medications When medications are taken as directed, they can greatly improve your health. But if they are not taken as prescribed, they may not work. In some cases, not taking them correctly can be harmful. To help ensure your treatment remains effective and safe, understand your medications and how to take them. Bring your medications to each visit for review by your provider.  Your lab results, notes, and after visit summary will be available on My Chart. We strongly encourage you to use this feature. If lab results are abnormal the clinic will contact you with the appropriate steps. If the clinic does not contact you assume the results are satisfactory. You can always view your results on My Chart. If you have questions regarding your health or results, please contact the clinic during office hours. You can also ask questions on My Chart.  We at [facility] are grateful that you chose [place] to provide your care. We strive to provide evidence-based and compassionate care and are always looking for feedback. If you get a survey from the clinic please complete this so we can hear your opinions.  Prediabetes: Eating Plan Prediabetes is when your levels of blood sugar, also called glucose, are higher than normal. This can put you at risk for getting type 2 diabetes. When you have prediabetes, making healthy changes can help keep you from getting diabetes. This includes changes in your diet. Work with your health care provider or an expert in healthy eating called a dietitian. They can help you create a healthy eating plan. This plan can help you: Control your blood sugar levels. Improve your cholesterol levels. Manage your blood pressure. What are tips for following this plan? Reading food labels Read food labels to check the amount of fat and sugar in prepackaged foods. Avoid foods that have: Saturated fats. Trans fats. Added sugars. Check  food labels for the amount of salt (sodium). Avoid foods that have more than 300 milligrams (mg) of salt per serving. Limit your salt intake to less than 2,300 mg each day. Shopping Avoid buying pre-made and processed foods. Avoid buying drinks with added sugar. Cooking Cook with olive oil. Do not use: Butter. Lard. Ghee. Bake, broil, grill, steam, or boil foods. Avoid frying. Meal planning  Work with your dietitian to create an eating plan that's right for you. This may include tracking how many calories you take in each day. Use a food diary, notebook, or mobile app to track what you eat at each meal. Consider following a Mediterranean diet. This includes: Eating many servings of fresh fruits and vegetables each day. Eating fish at least twice a week. Eating one serving each day of whole grains, beans, nuts, and seeds. Using olive oil instead of other fats. Limiting alcohol. Limiting red meat. Using nonfat or low-fat dairy products. Consider following a plant-based diet. This means eating mostly: Vegetables and fruit. Grains. Beans. Nuts and seeds. If you have high blood pressure, you may need to limit your salt intake or follow a diet called the DASH eating plan. The DASH eating plan can help lower high blood pressure. Lifestyle Set weight loss goals with help from your health care team. Losing 7% of your body weight is a good goal for most people with prediabetes. Exercise for at least 30 minutes, 5 or more days a week. For support, think about joining a support group or talking with a mental health counselor. Take medicines only as  told. What foods are recommended? Fruits Berries. Bananas. Apples. Oranges. Grapes. Papaya. Mango. Pomegranate. Kiwi. Grapefruit. Cherries. Vegetables Lettuce. Spinach. Peas. Beets. Cauliflower. Cabbage. Broccoli. Carrots. Tomatoes. Squash. Eggplant. Herbs. Peppers. Onions. Cucumbers. Brussels sprouts. Grains Whole grains, such as whole-wheat  or whole-grain breads or pasta. Unsweetened oatmeal. Bulgur. Barley. Quinoa. Brown rice. Corn or whole-wheat flour tortillas or taco shells. Meats and other proteins Seafood. Poultry without skin. Lean cuts of pork and beef. Tofu. Eggs. Nuts. Beans. Dairy Low-fat or fat-free dairy products, such as yogurt, cottage cheese, and cheese. Beverages Water. Tea. Coffee. Sugar-free or diet soda. Seltzer water. Low-fat or nonfat milk. Milk alternatives, such as soy or almond milk. Fats and oils Olive oil. Canola oil. Sunflower oil. Grapeseed oil. Avocado. Walnuts. Sweets and desserts Sugar-free or low-fat pudding. Sugar-free or low-fat ice cream and other frozen treats. Seasonings and condiments Herbs. Salt-free spices. Mustard. Relish. Low-salt, low-sugar ketchup. Low-salt, low-sugar barbecue sauce. Low-fat or fat-free mayonnaise. The items listed above may not be all the foods and drinks you can have. Talk with a dietitian to learn more. What foods are not recommended? Fruits Fruits canned with syrup. Vegetables Canned vegetables. Frozen vegetables with butter or cream sauce. Grains Refined white flour and flour products, such as bread, pasta, snack foods, and cereals. Meats and other proteins Fatty cuts of meat. Poultry with skin. Breaded or fried meat. Processed meats. Dairy Full-fat yogurt, cheese, or milk. Beverages Sweetened drinks, such as iced tea and soda. Fats and oils Butter. Lard. Ghee. Sweets and desserts Baked goods, such as cake, cupcakes, pastries, cookies, and cheesecake. Seasonings and condiments Spice mixes with added salt. Ketchup. Barbecue sauce. Mayonnaise. The items listed above may not be all the foods and drinks you should avoid. Talk with a dietitian to learn more. Where to find more information American Diabetes Association: diabetes.org/food-nutrition This information is not intended to replace advice given to you by your health care provider. Make sure you  discuss any questions you have with your health care provider. Document Revised: 03/23/2023 Document Reviewed: 03/23/2023 Elsevier Patient Education  2024 [employer].

## 2024-09-09 ENCOUNTER — Encounter: Payer: Self-pay | Admitting: Nurse Practitioner

## 2024-09-09 ENCOUNTER — Ambulatory Visit (INDEPENDENT_AMBULATORY_CARE_PROVIDER_SITE_OTHER): Admitting: Nurse Practitioner

## 2024-09-09 VITALS — BP 132/70 | HR 80 | Temp 97.9°F | Resp 16 | Ht 70.0 in | Wt 184.0 lb

## 2024-09-09 DIAGNOSIS — E6609 Other obesity due to excess calories: Secondary | ICD-10-CM

## 2024-09-09 DIAGNOSIS — Z23 Encounter for immunization: Secondary | ICD-10-CM

## 2024-09-09 DIAGNOSIS — E7800 Pure hypercholesterolemia, unspecified: Secondary | ICD-10-CM | POA: Diagnosis not present

## 2024-09-09 DIAGNOSIS — E66811 Obesity, class 1: Secondary | ICD-10-CM | POA: Diagnosis not present

## 2024-09-09 DIAGNOSIS — F17210 Nicotine dependence, cigarettes, uncomplicated: Secondary | ICD-10-CM

## 2024-09-09 DIAGNOSIS — M1380 Other specified arthritis, unspecified site: Secondary | ICD-10-CM | POA: Diagnosis not present

## 2024-09-09 DIAGNOSIS — Z6830 Body mass index (BMI) 30.0-30.9, adult: Secondary | ICD-10-CM | POA: Diagnosis not present

## 2024-09-09 DIAGNOSIS — J432 Centrilobular emphysema: Secondary | ICD-10-CM | POA: Diagnosis not present

## 2024-09-09 DIAGNOSIS — R7309 Other abnormal glucose: Secondary | ICD-10-CM

## 2024-09-09 DIAGNOSIS — I10 Essential (primary) hypertension: Secondary | ICD-10-CM | POA: Diagnosis not present

## 2024-09-09 DIAGNOSIS — R079 Chest pain, unspecified: Secondary | ICD-10-CM | POA: Diagnosis not present

## 2024-09-09 DIAGNOSIS — I700 Atherosclerosis of aorta: Secondary | ICD-10-CM

## 2024-09-09 LAB — MICROALBUMIN, URINE WAIVED
Creatinine, Urine Waived: 50 mg/dL (ref 10–300)
Microalb, Ur Waived: 10 mg/L (ref 0–19)

## 2024-09-09 LAB — BAYER DCA HB A1C WAIVED: HB A1C (BAYER DCA - WAIVED): 5.4 % (ref 4.8–5.6)

## 2024-09-09 NOTE — Assessment & Plan Note (Signed)
 A1c is trending down to now 5.4% from 6%, he has lost weight and worked on diet.  Urine ALB 11 September 2024.  Recommend heavy focus on diet and regular exercise to prevent ongoing trend up in this level.  Discussed risks at length.

## 2024-09-09 NOTE — Assessment & Plan Note (Addendum)
 Chronic, followed with rheumatology at [facility], continue this collaboration and medications as ordered by them. Recent notes reviewed.

## 2024-09-09 NOTE — Assessment & Plan Note (Signed)
 Chronic.  Noted on CT imaging 11/13/21.  Recommend complete cessation of smoking and continue statin therapy.

## 2024-09-09 NOTE — Assessment & Plan Note (Signed)
 Chronic, stable.  Recommend complete cessation of smoking, he is working on this. Continue Breztri  due to his symptom and stage of disease, benefits more from triple therapy.  Educated him on this inhaler and use + side effects.  Continue Albuterol  as needed, especially before activity.  Educated him on importance of proper inhaler use due to progressive nature of disease.  Lung cancer screening annually, next 02/17/25.

## 2024-09-09 NOTE — Assessment & Plan Note (Signed)
Chronic, ongoing with history of poor tolerance to Atorvastatin.  Continue Rosuvastatin three days a week, which he is tolerating.  Lipid panel today.

## 2024-09-09 NOTE — Assessment & Plan Note (Addendum)
 Acute for a few days, appears more muscular in nature. EKG reassuring. At this time recommend he use Voltaren gel or Icy/Hot if needed. Strict ER precautions provided. He feels it is improving.

## 2024-09-09 NOTE — Progress Notes (Signed)
 "  BP 132/70 (BP Location: Left Arm, Patient Position: Sitting, Cuff Size: Normal)   Pulse 80   Temp 97.9 F (36.6 C) (Oral)   Resp 16   Ht 5' 10 (1.778 m)   Wt 184 lb (83.5 kg)   SpO2 94%   BMI 26.40 kg/m    Subjective:    Patient ID: Hunter Huffman, male    DOB: [birth year], 63 y.o.   MRN: [id number]  HPI: Hunter Huffman is a 64 y.o. male  Chief Complaint  Patient presents with   Follow-up    Here for six month follow up   COPD    Up until the day before yesterday he was doing pretty well.   Hypertension    Never checks bp at home   Arthritis    All typical right sided pain   Chest Pain    Not sure if its a pull muscle related.   INFLAMMATORY ARTHRITIS: Continues to follow with rheumatology with last visit 07/28/24.  No changes made. Continues Enbrel. Has daily baseline pain.   HYPERTENSION/HYPERLIPIDEMIA Continues HCTZ and Rosuvastatin . History of reaction with Atorvastatin , but tolerates Rosuvastatin  3 days a week. Has been working on health and weight loss. This morning he ate a hardy southern breakfast. Helping to care for his significant other since December 8th, she is in hospital at [facility].                                Hypertension status: stable  Satisfied with current treatment? yes Duration of hypertension: chronic BP monitoring frequency:  not checking BP range:  BP medication side effects:  no Medication compliance: good compliance Previous BP meds: Aspirin: no Recurrent headaches: no Visual changes: no Palpitations: no Dyspnea: no Chest pain: to left side of chest, but has been moving his significant other a lot in hospital. Started to have this pain 3 days ago and noticed at night. He is not hurting near as much as he was. Rates it as 0/10 at present, but if coughs 6/10. Only feels it is coughing or moving certain ways in bed. Took two Tylenol  PM last night which helped. No other symptoms presenting with pain. He thinks it is more muscular.  Lower  extremity edema: no Dizzy/lightheaded: no   Impaired Fasting Glucose HbA1C:  Lab Results  Component Value Date   HGBA1C 6.0 (H) 03/09/2024  Duration of elevated blood sugar: chronic Polydipsia: no Polyuria: no Weight change: no Visual disturbance: no Glucose Monitoring: no    Accucheck frequency: Not Checking    Fasting glucose:     Post prandial:  Diabetic Education: Not Completed Family history of diabetes: yes    COPD Continues Breztri  twice daily and Albuterol  as needed. Last exacerbation 11/05/23. Continues to smoke, smoking 1/2 PPD light cigarettes a day.  Has been smoking since teen years.  Moderate centrilobular emphysema and aortic atherosclerosis on imaging. Last lung screening was 02/18/24. States he has overall been feeling a ton better lung wise. COPD status: stable Satisfied with current treatment?: yes Oxygen use: no Dyspnea frequency: no Cough frequency: coughing less Rescue inhaler frequency: has not used in long while Limitation of activity: no Productive cough: no Last Spirometry: 08/28/23 Pneumovax: Up To Date Influenza: Up to Date   Relevant past medical, surgical, family and social history reviewed and updated as indicated. Interim medical history since our last visit reviewed. Allergies and medications reviewed and updated.  Review of Systems  Constitutional:  Negative for activity change, appetite change, diaphoresis, fatigue and fever.  Respiratory:  Negative for cough, chest tightness, shortness of breath and wheezing.   Cardiovascular:  Positive for chest pain (occasional with coughing or movement). Negative for palpitations and leg swelling.  Gastrointestinal: Negative.   Neurological: Negative.   Psychiatric/Behavioral: Negative.     Per HPI unless specifically indicated above     Objective:    BP 132/70 (BP Location: Left Arm, Patient Position: Sitting, Cuff Size: Normal)   Pulse 80   Temp 97.9 F (36.6 C) (Oral)   Resp 16   Ht 5' 10  (1.778 m)   Wt 184 lb (83.5 kg)   SpO2 94%   BMI 26.40 kg/m   Wt Readings from Last 3 Encounters:  09/09/24 184 lb (83.5 kg)  03/09/24 210 lb 3.2 oz (95.3 kg)  12/31/23 209 lb (94.8 kg)    Physical Exam Vitals and nursing note reviewed.  Constitutional:      General: He is awake. He is not in acute distress.    Appearance: He is well-developed and well-groomed. He is not ill-appearing or toxic-appearing.  HENT:     Head: Normocephalic.     Right Ear: Hearing and external ear normal.     Left Ear: Hearing and external ear normal.  Eyes:     General: Lids are normal.     Extraocular Movements: Extraocular movements intact.     Conjunctiva/sclera: Conjunctivae normal.  Neck:     Thyroid : No thyromegaly.     Vascular: No carotid bruit.  Cardiovascular:     Rate and Rhythm: Normal rate and regular rhythm.     Heart sounds: Normal heart sounds. No murmur heard.    No gallop.     Comments: No rashes noted to chest area. Pulmonary:     Effort: No accessory muscle usage or respiratory distress.     Breath sounds: Normal breath sounds. No decreased breath sounds, wheezing or rales.  Abdominal:     General: Bowel sounds are normal. There is no distension.     Palpations: Abdomen is soft.     Tenderness: There is no abdominal tenderness.  Musculoskeletal:     Cervical back: Full passive range of motion without pain.     Right lower leg: No edema.     Left lower leg: No edema.  Lymphadenopathy:     Cervical: No cervical adenopathy.  Skin:    General: Skin is warm.     Capillary Refill: Capillary refill takes less than 2 seconds.  Neurological:     Mental Status: He is alert and oriented to person, place, and time.     Deep Tendon Reflexes: Reflexes are normal and symmetric.     Reflex Scores:      Brachioradialis reflexes are 2+ on the right side and 2+ on the left side.      Patellar reflexes are 2+ on the right side and 2+ on the left side. Psychiatric:        Attention and  Perception: Attention normal.        Mood and Affect: Mood normal.        Speech: Speech normal.        Behavior: Behavior normal. Behavior is cooperative.        Thought Content: Thought content normal.   EKG My review and personal interpretation at Time: 0930   Indication: chest discomfort  Rate: 70  Rhythm: sinus Axis: normal  Other: nonspecific st abn, no stemi, no lvh  Results for orders placed or performed in visit on 03/09/24  Bayer DCA Hb A1c Waived   Collection Time: 03/09/24  9:14 AM  Result Value Ref Range   HB A1C (BAYER DCA - WAIVED) 6.0 (H) 4.8 - 5.6 %  Microalbumin, Urine Waived   Collection Time: 03/09/24  9:14 AM  Result Value Ref Range   Microalb, Ur Waived 10 0 - 19 mg/L   Creatinine, Urine Waived 50 10 - 300 mg/dL   Microalb/Creat Ratio <30 <30 mg/g  CBC with Differential/Platelet   Collection Time: 03/09/24  9:15 AM  Result Value Ref Range   WBC 5.9 3.4 - 10.8 x10E3/uL   RBC 4.80 4.14 - 5.80 x10E6/uL   Hemoglobin 15.8 13.0 - 17.7 g/dL   Hematocrit 52.5 62.4 - 51.0 %   MCV 99 (H) 79 - 97 fL   MCH 32.9 26.6 - 33.0 pg   MCHC 33.3 31.5 - 35.7 g/dL   RDW 87.7 88.3 - 84.5 %   Platelets 237 150 - 450 x10E3/uL   Neutrophils 48 Not Estab. %   Lymphs 39 Not Estab. %   Monocytes 10 Not Estab. %   Eos 2 Not Estab. %   Basos 1 Not Estab. %   Neutrophils Absolute 2.8 1.4 - 7.0 x10E3/uL   Lymphocytes Absolute 2.3 0.7 - 3.1 x10E3/uL   Monocytes Absolute 0.6 0.1 - 0.9 x10E3/uL   EOS (ABSOLUTE) 0.1 0.0 - 0.4 x10E3/uL   Basophils Absolute 0.1 0.0 - 0.2 x10E3/uL   Immature Granulocytes 0 Not Estab. %   Immature Grans (Abs) 0.0 0.0 - 0.1 x10E3/uL  Comprehensive metabolic panel with GFR   Collection Time: 03/09/24  9:15 AM  Result Value Ref Range   Glucose 78 70 - 99 mg/dL   BUN 9 8 - 27 mg/dL   Creatinine, Ser 8.77 0.76 - 1.27 mg/dL   eGFR 67 >40 fO/fpw/8.26   BUN/Creatinine Ratio 7 (L) 10 - 24   Sodium 139 134 - 144 mmol/L   Potassium 4.4 3.5 - 5.2 mmol/L    Chloride 101 96 - 106 mmol/L   CO2 24 20 - 29 mmol/L   Calcium  9.9 8.6 - 10.2 mg/dL   Total Protein 6.4 6.0 - 8.5 g/dL   Albumin 4.4 3.9 - 4.9 g/dL   Globulin, Total 2.0 1.5 - 4.5 g/dL   Bilirubin Total 0.6 0.0 - 1.2 mg/dL   Alkaline Phosphatase 72 44 - 121 IU/L   AST 19 0 - 40 IU/L   ALT 17 0 - 44 IU/L  Lipid Panel w/o Chol/HDL Ratio   Collection Time: 03/09/24  9:15 AM  Result Value Ref Range   Cholesterol, Total 124 100 - 199 mg/dL   Triglycerides 76 0 - 149 mg/dL   HDL 51 >60 mg/dL   VLDL Cholesterol Cal 15 5 - 40 mg/dL   LDL Chol Calc (NIH) 58 0 - 99 mg/dL  TSH   Collection Time: 03/09/24  9:15 AM  Result Value Ref Range   TSH 1.160 0.450 - 4.500 uIU/mL  Magnesium   Collection Time: 03/09/24  9:15 AM  Result Value Ref Range   Magnesium 2.0 1.6 - 2.3 mg/dL  PSA   Collection Time: 03/09/24  9:15 AM  Result Value Ref Range   Prostate Specific Ag, Serum 2.3 0.0 - 4.0 ng/mL  VITAMIN D  25 Hydroxy (Vit-D Deficiency, Fractures)   Collection Time: 03/09/24  9:15 AM  Result Value Ref Range  Vit D, 25-Hydroxy 43.9 30.0 - 100.0 ng/mL      Assessment & Plan:   Problem List Items Addressed This Visit       Cardiovascular and Mediastinum   Hypertension   Chronic, stable.  BP at goal in office.  Recommend he monitor BP at least a few mornings a week at home and document.  DASH diet at home.  Continue current medication regimen and adjust as needed.  Labs today: CMP.  Urine ALB 11 September 2024.  Refills up to date.       Relevant Orders   Microalbumin, Urine Waived   Comprehensive metabolic panel with GFR   Aortic atherosclerosis   Chronic.  Noted on CT imaging 11/13/21.  Recommend complete cessation of smoking and continue statin therapy.      Relevant Orders   Lipid Panel w/o Chol/HDL Ratio   Comprehensive metabolic panel with GFR     Respiratory   Centrilobular emphysema (HCC) - Primary   Chronic, stable.  Recommend complete cessation of smoking, he is working on  this. Continue Breztri  due to his symptom and stage of disease, benefits more from triple therapy.  Educated him on this inhaler and use + side effects.  Continue Albuterol  as needed, especially before activity.  Educated him on importance of proper inhaler use due to progressive nature of disease.  Lung cancer screening annually, next 02/17/25.         Musculoskeletal and Integument   Chronic inflammatory arthritis   Chronic, followed with rheumatology at [facility], continue this collaboration and medications as ordered by them. Recent notes reviewed.        Other   Obesity   BMI 26.40, has been working on diet and exercise. Praised for this. Recommended eating smaller high protein, low fat meals more frequently and exercising 30 mins a day 5 times a week with a goal of 10-15lb weight loss in the next 3 months. Patient voiced their understanding and motivation to adhere to these recommendations.      Nicotine  dependence, cigarettes, uncomplicated   I have recommended complete cessation of tobacco use. I have discussed various options available for assistance with tobacco cessation including over the counter methods (Nicotine  gum, patch and lozenges). We also discussed prescription options (Chantix, Nicotine  Inhaler / Nasal Spray).       Hypercholesteremia   Chronic, ongoing with history of poor tolerance to Atorvastatin .  Continue Rosuvastatin  three days a week, which he is tolerating.  Lipid panel today.      Relevant Orders   Lipid Panel w/o Chol/HDL Ratio   Comprehensive metabolic panel with GFR   Elevated hemoglobin A1c measurement   A1c is trending down to now 5.4% from 6%, he has lost weight and worked on diet.  Urine ALB 11 September 2024.  Recommend heavy focus on diet and regular exercise to prevent ongoing trend up in this level.  Discussed risks at length.      Relevant Orders   Bayer DCA Hb A1c Waived   Chest pain   Acute for a few days, appears more muscular in  nature. EKG reassuring. At this time recommend he use Voltaren gel or Icy/Hot if needed. Strict ER precautions provided. He feels it is improving.      Relevant Orders   EKG 12-Lead     Follow up plan: Return in about 6 months (around 03/09/2025) for Annual Physical after 03/09/25.      "

## 2024-09-09 NOTE — Assessment & Plan Note (Addendum)
 Chronic, stable.  BP at goal in office.  Recommend he monitor BP at least a few mornings a week at home and document.  DASH diet at home.  Continue current medication regimen and adjust as needed.  Labs today: CMP.  Urine ALB 11 September 2024.  Refills up to date.

## 2024-09-09 NOTE — Assessment & Plan Note (Signed)
 I have recommended complete cessation of tobacco use. I have discussed various options available for assistance with tobacco cessation including over the counter methods (Nicotine  gum, patch and lozenges). We also discussed prescription options (Chantix, Nicotine  Inhaler / Nasal Spray).

## 2024-09-09 NOTE — Assessment & Plan Note (Addendum)
 BMI 26.40, has been working on diet and exercise. Praised for this. Recommended eating smaller high protein, low fat meals more frequently and exercising 30 mins a day 5 times a week with a goal of 10-15lb weight loss in the next 3 months. Patient voiced their understanding and motivation to adhere to these recommendations.

## 2024-09-10 ENCOUNTER — Ambulatory Visit: Payer: Self-pay | Admitting: Nurse Practitioner

## 2024-09-10 LAB — LIPID PANEL W/O CHOL/HDL RATIO
Cholesterol, Total: 101 mg/dL (ref 100–199)
HDL: 50 mg/dL
LDL Chol Calc (NIH): 38 mg/dL (ref 0–99)
Triglycerides: 54 mg/dL (ref 0–149)
VLDL Cholesterol Cal: 13 mg/dL (ref 5–40)

## 2024-09-10 LAB — COMPREHENSIVE METABOLIC PANEL WITH GFR
ALT: 33 IU/L (ref 0–44)
AST: 34 IU/L (ref 0–40)
Albumin: 3.7 g/dL — ABNORMAL LOW (ref 3.9–4.9)
Alkaline Phosphatase: 95 IU/L (ref 47–123)
BUN/Creatinine Ratio: 10 (ref 10–24)
BUN: 10 mg/dL (ref 8–27)
Bilirubin Total: 0.6 mg/dL (ref 0.0–1.2)
CO2: 24 mmol/L (ref 20–29)
Calcium: 9.7 mg/dL (ref 8.6–10.2)
Chloride: 101 mmol/L (ref 96–106)
Creatinine, Ser: 1.03 mg/dL (ref 0.76–1.27)
Globulin, Total: 2.1 g/dL (ref 1.5–4.5)
Glucose: 129 mg/dL — ABNORMAL HIGH (ref 70–99)
Potassium: 4.3 mmol/L (ref 3.5–5.2)
Sodium: 140 mmol/L (ref 134–144)
Total Protein: 5.8 g/dL — ABNORMAL LOW (ref 6.0–8.5)
eGFR: 81 mL/min/1.73

## 2024-09-10 NOTE — Progress Notes (Signed)
 Contacted via MyChart  Good morning Hunter Huffman, your labs have returned and overall look good: - Kidney function, creatinine and eGFR, remains normal, as is liver function, AST and ALT.  - LDL is well below goal for stroke prevention. Great news!! Any questions? Keep being stellar!!  Thank you for allowing me to participate in your care.  I appreciate you. Kindest regards, Leetta Hendriks

## 2025-01-06 ENCOUNTER — Ambulatory Visit

## 2025-03-14 ENCOUNTER — Encounter: Admitting: Nurse Practitioner
